# Patient Record
Sex: Female | Born: 1990 | Race: Asian | Hispanic: No | Marital: Married | Smoking: Never smoker
Health system: Southern US, Community
[De-identification: ages and names within clinical notes are randomized; demographics above are authoritative.]

## PROBLEM LIST (undated history)

## (undated) ENCOUNTER — Inpatient Hospital Stay (HOSPITAL_COMMUNITY): Payer: Self-pay

## (undated) DIAGNOSIS — T7840XA Allergy, unspecified, initial encounter: Secondary | ICD-10-CM

## (undated) DIAGNOSIS — F32A Depression, unspecified: Secondary | ICD-10-CM

## (undated) DIAGNOSIS — F419 Anxiety disorder, unspecified: Secondary | ICD-10-CM

## (undated) DIAGNOSIS — O24419 Gestational diabetes mellitus in pregnancy, unspecified control: Secondary | ICD-10-CM

## (undated) HISTORY — DX: Gestational diabetes mellitus in pregnancy, unspecified control: O24.419

## (undated) HISTORY — DX: Allergy, unspecified, initial encounter: T78.40XA

## (undated) HISTORY — DX: Anxiety disorder, unspecified: F41.9

## (undated) HISTORY — DX: Depression, unspecified: F32.A

---
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
## 2016-02-19 ENCOUNTER — Encounter (HOSPITAL_COMMUNITY): Payer: Self-pay | Admitting: *Deleted

## 2016-02-19 ENCOUNTER — Inpatient Hospital Stay (HOSPITAL_COMMUNITY)
Admission: AD | Admit: 2016-02-19 | Discharge: 2016-02-19 | Disposition: A | Discharge status: Home / Self Care | Payer: 59 | Source: Ambulatory Visit | Attending: Family Medicine | Admitting: Family Medicine

## 2016-02-19 DIAGNOSIS — O039 Complete or unspecified spontaneous abortion without complication: Secondary | ICD-10-CM | POA: Diagnosis not present

## 2016-02-19 DIAGNOSIS — O021 Missed abortion: Secondary | ICD-10-CM | POA: Diagnosis not present

## 2016-02-19 LAB — CBC
HEMATOCRIT: 33.3 % — AB (ref 36.0–46.0)
Hemoglobin: 11.6 g/dL — ABNORMAL LOW (ref 12.0–15.0)
MCH: 31.4 pg (ref 26.0–34.0)
MCHC: 34.8 g/dL (ref 30.0–36.0)
MCV: 90 fL (ref 78.0–100.0)
Platelets: 301 10*3/uL (ref 150–400)
RBC: 3.7 MIL/uL — ABNORMAL LOW (ref 3.87–5.11)
RDW: 12.1 % (ref 11.5–15.5)
WBC: 24.4 10*3/uL — AB (ref 4.0–10.5)

## 2016-02-19 LAB — ABO/RH: ABO/RH(D): A POS

## 2016-02-19 LAB — TYPE AND SCREEN
ABO/RH(D): A POS
Antibody Screen: NEGATIVE

## 2016-02-19 MED ORDER — MISOPROSTOL 200 MCG PO TABS
800.0000 ug | ORAL_TABLET | Freq: Once | ORAL | Status: AC
  Administered 2016-02-19: 800 ug via ORAL
  Filled 2016-02-19: qty 4

## 2016-02-19 MED ORDER — OXYCODONE-ACETAMINOPHEN 5-325 MG PO TABS: 1.0000 | ORAL_TABLET | ORAL | 0 refills | Status: DC | PRN

## 2016-02-19 MED ORDER — IBUPROFEN 600 MG PO TABS
600.0000 mg | ORAL_TABLET | Freq: Once | ORAL | Status: AC
  Administered 2016-02-19: 600 mg via ORAL
  Filled 2016-02-19: qty 1

## 2016-02-19 NOTE — Progress Notes (Addendum)
Chaplain paged to provide emotional and spiritual care to Lynn Torres after she experienced a fetal demise. When chaplain arrived Lynn Deleon decided she did not wish to talk with a chaplain, and declined the visit through her room nurse.  Please page a chaplain should Lynn Deleon request or need emotional or spiritual care. Chaplains are available at all times.  Benjie Karvonenharles D. Bayyinah Dukeman, DMin. MDiv Night Chaplain

## 2016-02-19 NOTE — MAU Provider Note (Signed)
History     CSN: [id number]  Arrival date and time: 02/19/16 1439   None     No chief complaint on file.  HPI   Lynn Torres is a 25 y.o. female G1P0010 at unknown gestation here in MAU after passing a "fetus" at home. She did not know she was pregnant. She took a pregnancy test 2.5 months ago at home and it was negative. She started bleeding 2 days ago and she thought it was her menstrual cycle; she has a history of abnormal cycles. She started cramping heavily this morning and passed a fetus around 1:30 pm. She continued to have some bleeding and felt a cord in her vagina. She feels that the cord has gradually gotten longer, however she is concerned it is still attached inside of her. She is having mild contraction/cramping like pain that comes and goes.   The patient is a Engineer, civil (consulting)nurse at [employer].  She did not bring the fetus with her to MAU however is ok with having someone go home to get the fetus.   OB History    Gravida Para Term Preterm AB Living   1 0     1     SAB TAB Ectopic Multiple Live Births   1              History reviewed. No pertinent past medical history.  History reviewed. No pertinent surgical history.  History reviewed. No pertinent family history.  Social History  Substance Use Topics  . Smoking status: Never Smoker  . Smokeless tobacco: Never Used  . Alcohol use No    Allergies: No Known Allergies  No prescriptions prior to admission.   Results for orders placed or performed during the hospital encounter of 02/19/16 (from the past 48 hour(s))  [facility] screen [facility]     Status: None   Collection Time: 02/19/16  4:12 PM  Result Value Ref Range   ABO/RH(D) A POS    Antibody Screen NEG    Sample Expiration 02/22/2016   CBC     Status: Abnormal   Collection Time: 02/19/16  4:12 PM  Result Value Ref Range   WBC 24.4 (H) 4.0 - 10.5 K/uL   RBC 3.70 (L) 3.87 - 5.11 MIL/uL   Hemoglobin 11.6 (L) 12.0 - 15.0 g/dL   HCT 40.933.3  (L) 81.136.0 - 46.0 %   MCV 90.0 78.0 - 100.0 fL   MCH 31.4 26.0 - 34.0 pg   MCHC 34.8 30.0 - 36.0 g/dL   RDW 91.412.1 78.211.5 - 95.615.5 %   Platelets 301 150 - 400 K/uL    Review of Systems  Gastrointestinal: Positive for abdominal pain. Negative for nausea.  Neurological: Positive for headaches.   Physical Exam   Blood pressure 134/82, pulse 104, temperature 98.2 F (36.8 C), resp. rate 18.   Patient Vitals for the past 24 hrs:  BP Temp Temp src Pulse Resp  02/19/16 1921 126/74 - - 80 18  02/19/16 1749 120/78 98.3 F (36.8 C) Oral 87 18  02/19/16 1531 134/82 98.2 F (36.8 C) - 104 18    Physical Exam  Constitutional: She is oriented to person, place, and time. She appears well-developed and well-nourished. No distress.  Respiratory: Effort normal.  GI: Soft. She exhibits no distension. There is tenderness in the periumbilical area and suprapubic area.  Genitourinary:  Genitourinary Comments: Cord noted in the vagina  Bleeding bright red, and moderate amount upon arrival   Musculoskeletal:  Normal range of motion.  Neurological: She is alert and oriented to person, place, and time.  Skin: Skin is warm. She is not diaphoretic.  Psychiatric: Her behavior is normal.    MAU Course  Procedures  None  MDM  A positive blood type CBC & urine drug screen  Dr. Doreene Burke to the bedside> See Dr. Denyce Robert note and physical exam. Cytotec 800 mcg buccal given Ibuprofen 600 mg PO> pain significantly improved  Delivery of placenta successful following cytotec. Placenta appears to be intact.  Patient desires the fetus to be cremated; arrangements made through the nursing supervisor.  L&D nurse called for infant photo's, foot prints and hand prints   Fetal weight 137 grams, CRL 13 cm. These findings consistent with approximately [redacted] weeks gestation. Fetus appears female.  Placenta sent to pathology, fetus to the morgue.   Prior to discharge the patient;s bleeding is minimal to none on pad.  Pain is 0/10. Patient leaving MAU with significant other and friend.   Assessment and Plan    A:  1. SAB (spontaneous abortion)     P:  Discharge home in stable condition Rx: Percocet  Ok to use ibuprofen as directed on the bottle Follow up in the [facility], message sent to the clinic for follow up.  Strict return precautions Bleeding precautions discussed at length Support given.    Duane Lope, NP 02/19/2016 7:33 PM

## 2016-02-19 NOTE — Discharge Instructions (Signed)
Miscarriage  A miscarriage is the sudden loss of an unborn baby (fetus) before the 20th week of pregnancy. Most miscarriages happen in the first 3 months of pregnancy. Sometimes, it happens before a woman even knows she is pregnant. A miscarriage is also called a "spontaneous miscarriage" or "early pregnancy loss." Having a miscarriage can be an emotional experience. Talk with your caregiver about any questions you may have about miscarrying, the grieving process, and your future pregnancy plans.  CAUSES    Problems with the fetal chromosomes that make it impossible for the baby to develop normally. Problems with the baby's genes or chromosomes are most often the result of errors that occur, by chance, as the embryo divides and grows. The problems are not inherited from the parents.   Infection of the cervix or uterus.    Hormone problems.    Problems with the cervix, such as having an incompetent cervix. This is when the tissue in the cervix is not strong enough to hold the pregnancy.    Problems with the uterus, such as an abnormally shaped uterus, uterine fibroids, or congenital abnormalities.    Certain medical conditions.    Smoking, drinking alcohol, or taking illegal drugs.    Trauma.   Often, the cause of a miscarriage is unknown.   SYMPTOMS    Vaginal bleeding or spotting, with or without cramps or pain.   Pain or cramping in the abdomen or lower back.   Passing fluid, tissue, or blood clots from the vagina.  DIAGNOSIS   Your caregiver will perform a physical exam. You may also have an ultrasound to confirm the miscarriage. Blood or urine tests may also be ordered.  TREATMENT    Sometimes, treatment is not necessary if you naturally pass all the fetal tissue that was in the uterus. If some of the fetus or placenta remains in the body (incomplete miscarriage), tissue left behind may become infected and must be removed. Usually, a dilation and curettage (D and C) procedure is performed.  During a D and C procedure, the cervix is widened (dilated) and any remaining fetal or placental tissue is gently removed from the uterus.   Antibiotic medicines are prescribed if there is an infection. Other medicines may be given to reduce the size of the uterus (contract) if there is a lot of bleeding.   If you have Rh negative blood and your baby was Rh positive, you will need a Rh immunoglobulin shot. This shot will protect any future baby from having Rh blood problems in future pregnancies.  HOME CARE INSTRUCTIONS    Your caregiver may order bed rest or may allow you to continue light activity. Resume activity as directed by your caregiver.   Have someone help with home and family responsibilities during this time.    Keep track of the number of sanitary pads you use each day and how soaked (saturated) they are. Write down this information.    Do not use tampons. Do not douche or have sexual intercourse until approved by your caregiver.    Only take over-the-counter or prescription medicines for pain or discomfort as directed by your caregiver.    Do not take aspirin. Aspirin can cause bleeding.    Keep all follow-up appointments with your caregiver.    If you or your partner have problems with grieving, talk to your caregiver or seek counseling to help cope with the pregnancy loss. Allow enough time to grieve before trying to get pregnant again.     SEEK IMMEDIATE MEDICAL CARE IF:    You have severe cramps or pain in your back or abdomen.   You have a fever.   You pass large blood clots (walnut-sized or larger) ortissue from your vagina. Save any tissue for your caregiver to inspect.    Your bleeding increases.    You have a thick, bad-smelling vaginal discharge.   You become lightheaded, weak, or you faint.    You have chills.   MAKE SURE YOU:   Understand these instructions.   Will watch your condition.   Will get help right away if you are not doing well or get worse.     This  information is not intended to replace advice given to you by your health care provider. Make sure you discuss any questions you have with your health care provider.     Document Released: 10/16/2000 Document Revised: 08/17/2012 Document Reviewed: 06/11/2011  Elsevier Interactive Patient Education 2016 Elsevier Inc[employer]

## 2016-02-19 NOTE — MAU Note (Signed)
Pt presents to MAU stating that she had miscarried this morning at home.. States that she does not remember when her last cycle was and passed the baby at home. Small amount of bleeding noted. Denies any pain at present

## 2016-02-20 ENCOUNTER — Telehealth (HOSPITAL_COMMUNITY): Payer: Self-pay | Admitting: Lactation Services

## 2016-02-20 MED FILL — OXYCODONE/APAP 5/325 MG TAB: 5-325 | 1 days supply | Qty: 6 | Fill #0

## 2016-02-20 NOTE — Telephone Encounter (Signed)
I left message with patient, wanting to provide support and answer any questions she may have about breast management/lactation suppression, as her milk may come in after her miscarriage. I left the Lactation phone number in case she is interested in speaking with one of us. Glenetta HewKim Aliyah Abeyta, RN, IBCLC

## 2016-02-21 ENCOUNTER — Encounter (HOSPITAL_COMMUNITY): Payer: Self-pay | Admitting: Obstetrics and Gynecology

## 2016-03-06 ENCOUNTER — Encounter: Payer: 59 | Admitting: Obstetrics and Gynecology

## 2016-04-03 ENCOUNTER — Encounter: Payer: Self-pay | Admitting: Obstetrics and Gynecology

## 2016-04-03 ENCOUNTER — Ambulatory Visit (INDEPENDENT_AMBULATORY_CARE_PROVIDER_SITE_OTHER): Payer: 59 | Admitting: Obstetrics and Gynecology

## 2016-04-03 ENCOUNTER — Ambulatory Visit (INDEPENDENT_AMBULATORY_CARE_PROVIDER_SITE_OTHER): Payer: Self-pay | Admitting: Clinical

## 2016-04-03 VITALS — BP 136/88 | HR 91 | Ht 64.0 in | Wt 166.8 lb

## 2016-04-03 DIAGNOSIS — Z3041 Encounter for surveillance of contraceptive pills: Secondary | ICD-10-CM

## 2016-04-03 DIAGNOSIS — F4320 Adjustment disorder, unspecified: Secondary | ICD-10-CM

## 2016-04-03 DIAGNOSIS — O039 Complete or unspecified spontaneous abortion without complication: Secondary | ICD-10-CM

## 2016-04-03 DIAGNOSIS — R4589 Other symptoms and signs involving emotional state: Secondary | ICD-10-CM | POA: Insufficient documentation

## 2016-04-03 DIAGNOSIS — Z01419 Encounter for gynecological examination (general) (routine) without abnormal findings: Secondary | ICD-10-CM

## 2016-04-03 DIAGNOSIS — Z5189 Encounter for other specified aftercare: Secondary | ICD-10-CM

## 2016-04-03 DIAGNOSIS — F329 Major depressive disorder, single episode, unspecified: Secondary | ICD-10-CM | POA: Insufficient documentation

## 2016-04-03 DIAGNOSIS — F4321 Adjustment disorder with depressed mood: Secondary | ICD-10-CM

## 2016-04-03 MED ORDER — HYDROXYZINE PAMOATE 25 MG PO CAPS: 25.0000 mg | ORAL_CAPSULE | Freq: Three times a day (TID) | ORAL | 1 refills | Status: DC | PRN

## 2016-04-03 MED ORDER — SERTRALINE HCL 25 MG PO TABS: 25.0000 mg | ORAL_TABLET | Freq: Every day | ORAL | 3 refills | Status: DC

## 2016-04-03 MED FILL — SERTRALINE HCL 25 MG TABLET: 25 | 30 days supply | Qty: 30 | Fill #0

## 2016-04-03 MED FILL — HYDROXYZINE PAM 25 MG CAP: 25 | 10 days supply | Qty: 30 | Fill #0

## 2016-04-03 NOTE — Progress Notes (Signed)
Subjective:     Lynn Torres is a 25 y.o. female and is here for a comprehensive physical exam. The patient reports problems - Difficulty with sleep pattern, depressive symptoms following SAB, She is interested in starting birth control pills, however has had unprotected sex in the last 1 week. She has never had a pap smear and is interested in having that today.   Patient was seen in MAU on 10/16 after delivering a 17 week non-viable fetus at home. She is here for follow up.   Social History   Social History  . Marital status: Married    Spouse name: N/A  . Number of children: N/A  . Years of education: N/A   Occupational History  . Not on file.   Social History Main Topics  . Smoking status: Never Smoker  . Smokeless tobacco: Never Used  . Alcohol use No  . Drug use: No  . Sexual activity: Yes    Birth control/ protection: None   Other Topics Concern  . Not on file   Social History Narrative  . No narrative on file   Health Maintenance  Topic Date Due  . HIV Screening  08/10/2007  . TETANUS/TDAP  08/10/2011  . PAP SMEAR  08/09/2013  . INFLUENZA VACCINE  12/05/2015    The following portions of the patient's history were reviewed and updated as appropriate: allergies, current medications, past family history, past medical history, past social history, past surgical history and problem list.  Review of Systems Behavioral/Psych: positive for depression, fatigue, loss of interest in favorite activities, sexual difficulty and sleep disturbance   Objective:   General appearance: alert, cooperative and no distress Head: Normocephalic, without obvious abnormality, atraumatic Eyes: conjunctivae/corneas clear. PERRL, EOM's intact. Fundi benign. Nose: Nares normal. Septum midline. Mucosa normal. No drainage or sinus tenderness. Abdomen: soft, non-tender; bowel sounds normal; no masses,  no organomegaly Pelvic: cervix normal in appearance, external genitalia normal, no adnexal  masses or tenderness, no cervical motion tenderness, rectovaginal septum normal, uterus normal size, shape, and consistency and vagina normal without discharge Extremities: extremities normal, atraumatic, no cyanosis or edema Skin: Skin color, texture, turgor normal. No rashes or lesions    Assessment:   Healthy female exam.   1. Pap test, as part of routine gynecological examination  - Cytology - PAP  2. Follow-up visit after miscarriage - referral to behavioral specialist   3. Surveillance for birth control, oral contraceptives  - unprotected sex 1 week ago, [facility]. If negative, patient ok for oral contraceptives.   4. Symptoms of depression  - Rx: Zoloft & vistaril for sleep  Discussed importance of PCP  Patient to see Asher MuirJamie today; information given for [facility] employee counseling    Plan:   Return to the [facility] in 1-2 weeks for pregnancy test See After Visit Summary for Counseling Recommendations      Duane LopeJennifer I Jurney Overacker, NP

## 2016-04-03 NOTE — BH Specialist Note (Signed)
Session Start time: 10:20   End Time: 10:45 Total Time:  25 minutes Type of Service: Behavioral Health - Individual/Family Interpreter: No.   Interpreter Name & Language: n/a # [facility]   SUBJECTIVE: Lynn Torres is a 25 y.o. female  Pt. was referred by Lynn CarbonJennifer Rasch, NP for:  anxiety and depression. Pt. reports the following symptoms/concerns: Pt states that she has been feeling depressed and anxious since miscarriage, and that it has affected her sleep, including going several days without sleep, then oversleeping several days, has had panic attacks and symptoms of depression in past; copes by writing in her journal. 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  Pt may benefit from psychoeducation and brief therapeutic interventions regarding coping with symptoms of anxiety and depression related to recent loss.   PLAN: 1. F/U with behavioral health clinician: As needed, if symptoms increase or do not improve 2. Behavioral Health meds: none 3. Behavioral recommendations:  -Consider implementing "Worry Hour" technique into daily journaling -Consider calming sound apps at bedtime for improved sleep -Read educational material regarding coping with symptoms of anxiety and depression(including panic attacks), and share information with husband -Consider calling Employee  Assistance Counseling Program(EACP) to set up an appointment, as needed 4. Referral: Brief Counseling/Psychotherapy, Psychoeducation and Referral to Counselor/Psychotherapist  [facility]  Behavioral Health Clinician  Marlon PelWarmhandoff:   Warm Hand Off Completed.        Depression screen PHQ 2/9 04/03/2016  Decreased Interest 1  Down, Depressed, Hopeless 1  PHQ - 2 Score 2  Altered sleeping 2  Tired, decreased energy 2  Change in appetite 1  Feeling bad or failure about yourself  2  Trouble concentrating 1  Moving slowly or fidgety/restless 2  Suicidal thoughts 0  PHQ-9 Score 12   GAD 7 : Generalized Anxiety Score 04/03/2016  Nervous, Anxious, on Edge 2  Control/stop worrying 2  Worry too much - different things 2  Trouble relaxing 2  Restless 2  Easily annoyed or irritable 2  Afraid - awful might happen 1  Total GAD 7 Score 13

## 2016-04-03 NOTE — Patient Instructions (Addendum)
Place annual gynecologic exam patient instructions here.

## 2016-04-03 NOTE — BH Specialist Note (Signed)
Error

## 2016-04-03 NOTE — Progress Notes (Signed)
Here for follow up after miscarriage . Last unprotected intercourse 7 days.

## 2016-04-05 LAB — CYTOLOGY - PAP: Diagnosis: NEGATIVE

## 2016-04-17 ENCOUNTER — Ambulatory Visit (INDEPENDENT_AMBULATORY_CARE_PROVIDER_SITE_OTHER): Payer: 59 | Admitting: General Practice

## 2016-04-17 DIAGNOSIS — Z3202 Encounter for pregnancy test, result negative: Secondary | ICD-10-CM

## 2016-04-17 DIAGNOSIS — Z30011 Encounter for initial prescription of contraceptive pills: Secondary | ICD-10-CM

## 2016-04-17 MED ORDER — NORGESTIMATE-ETH ESTRADIOL 0.25-35 MG-MCG PO TABS: 1.0000 | ORAL_TABLET | Freq: Every day | ORAL | 11 refills | Status: DC

## 2016-04-17 MED FILL — MONO-LINYAH 28 TABLET: 0.25-35 | 84 days supply | Qty: 84 | Fill #0

## 2016-04-17 NOTE — Progress Notes (Signed)
Patient here to obtain negative upt to start OCPs. upt negative. Per Vonzella NippleJulie Wenzel, can send in sprintec. Patient informed. Patient verbalized understanding & had no questions

## 2016-04-18 LAB — POCT PREGNANCY, URINE: Preg Test, Ur: NEGATIVE

## 2016-04-24 DIAGNOSIS — J020 Streptococcal pharyngitis: Secondary | ICD-10-CM | POA: Diagnosis not present

## 2016-04-24 MED FILL — PENICILLIN VK 500 MG TABLET: 500 | 10 days supply | Qty: 20 | Fill #0

## 2016-05-07 MED FILL — SERTRALINE HCL 25 MG TABLET: 25 | 30 days supply | Qty: 30 | Fill #1

## 2016-07-18 MED FILL — MONO-LINYAH 28 TABLET: 0.25-35 | 84 days supply | Qty: 84 | Fill #1

## 2016-10-09 MED FILL — NORG-ETHIN ESTRA 0.25-0.035: 0.25-35 | 84 days supply | Qty: 84 | Fill #2

## 2017-01-01 MED FILL — NORG-ETHIN ESTRA 0.25-0.035: 0.25-35 | 84 days supply | Qty: 84 | Fill #3

## 2017-02-13 DIAGNOSIS — A0839 Other viral enteritis: Secondary | ICD-10-CM | POA: Diagnosis not present

## 2017-02-13 MED FILL — ONDANSETRON HCL 4 MG TABLET: 4 | 3 days supply | Qty: 12 | Fill #0

## 2017-04-28 ENCOUNTER — Other Ambulatory Visit: Payer: Self-pay

## 2017-04-28 ENCOUNTER — Emergency Department (HOSPITAL_COMMUNITY): Payer: 59

## 2017-04-28 ENCOUNTER — Encounter (HOSPITAL_COMMUNITY): Payer: Self-pay

## 2017-04-28 ENCOUNTER — Emergency Department (HOSPITAL_COMMUNITY)
Admission: EM | Admit: 2017-04-28 | Discharge: 2017-04-28 | Disposition: A | Discharge status: Home / Self Care | Payer: 59 | Attending: Emergency Medicine | Admitting: Emergency Medicine

## 2017-04-28 DIAGNOSIS — R079 Chest pain, unspecified: Secondary | ICD-10-CM | POA: Diagnosis not present

## 2017-04-28 DIAGNOSIS — R222 Localized swelling, mass and lump, trunk: Secondary | ICD-10-CM | POA: Diagnosis not present

## 2017-04-28 DIAGNOSIS — Z79899 Other long term (current) drug therapy: Secondary | ICD-10-CM | POA: Insufficient documentation

## 2017-04-28 DIAGNOSIS — R52 Pain, unspecified: Secondary | ICD-10-CM

## 2017-04-28 DIAGNOSIS — L03312 Cellulitis of back [any part except buttock]: Secondary | ICD-10-CM | POA: Insufficient documentation

## 2017-04-28 DIAGNOSIS — L02212 Cutaneous abscess of back [any part, except buttock]: Secondary | ICD-10-CM | POA: Diagnosis not present

## 2017-04-28 LAB — CBC WITH DIFFERENTIAL/PLATELET
BASOS PCT: 0 %
Basophils Absolute: 0 10*3/uL (ref 0.0–0.1)
Eosinophils Absolute: 0.2 10*3/uL (ref 0.0–0.7)
Eosinophils Relative: 1 %
HEMATOCRIT: 44.1 % (ref 36.0–46.0)
HEMOGLOBIN: 15.1 g/dL — AB (ref 12.0–15.0)
LYMPHS ABS: 2.8 10*3/uL (ref 0.7–4.0)
LYMPHS PCT: 21 %
MCH: 31 pg (ref 26.0–34.0)
MCHC: 34.2 g/dL (ref 30.0–36.0)
MCV: 90.6 fL (ref 78.0–100.0)
MONOS PCT: 6 %
Monocytes Absolute: 0.8 10*3/uL (ref 0.1–1.0)
NEUTROS ABS: 9.9 10*3/uL — AB (ref 1.7–7.7)
NEUTROS PCT: 72 %
Platelets: 343 10*3/uL (ref 150–400)
RBC: 4.87 MIL/uL (ref 3.87–5.11)
RDW: 11.9 % (ref 11.5–15.5)
WBC: 13.7 10*3/uL — ABNORMAL HIGH (ref 4.0–10.5)

## 2017-04-28 LAB — COMPREHENSIVE METABOLIC PANEL
ALK PHOS: 48 U/L (ref 38–126)
ALT: 51 U/L (ref 14–54)
AST: 31 U/L (ref 15–41)
Albumin: 4.3 g/dL (ref 3.5–5.0)
Anion gap: 14 (ref 5–15)
BUN: 8 mg/dL (ref 6–20)
CALCIUM: 9.8 mg/dL (ref 8.9–10.3)
CHLORIDE: 92 mmol/L — AB (ref 101–111)
CO2: 25 mmol/L (ref 22–32)
CREATININE: 0.57 mg/dL (ref 0.44–1.00)
GFR calc Af Amer: >60 mL/min (ref >60)
GFR calc non Af Amer: >60 mL/min (ref >60)
Glucose, Bld: 418 mg/dL — ABNORMAL HIGH (ref 65–99)
Potassium: 3.7 mmol/L (ref 3.5–5.1)
SODIUM: 131 mmol/L — AB (ref 135–145)
Total Bilirubin: 0.8 mg/dL (ref 0.3–1.2)
Total Protein: 8.4 g/dL — ABNORMAL HIGH (ref 6.5–8.1)

## 2017-04-28 LAB — I-STAT CG4 LACTIC ACID, ED
Lactic Acid, Venous: 3.54 mmol/L (ref 0.5–1.9)
Lactic Acid, Venous: 1.73 mmol/L (ref 0.5–1.9)

## 2017-04-28 LAB — I-STAT BETA HCG BLOOD, ED (MC, WL, AP ONLY)

## 2017-04-28 MED ORDER — CLINDAMYCIN HCL 150 MG PO CAPS: 300.0000 mg | ORAL_CAPSULE | Freq: Three times a day (TID) | ORAL | 0 refills | Status: AC

## 2017-04-28 MED ORDER — IOPAMIDOL (ISOVUE-370) INJECTION 76%
INTRAVENOUS | Status: AC
  Administered 2017-04-28: 100 mL via INTRAVENOUS
  Filled 2017-04-28: qty 100

## 2017-04-28 MED ORDER — SODIUM CHLORIDE 0.9 % IV BOLUS (SEPSIS)
1000.0000 mL | Freq: Once | INTRAVENOUS | Status: AC
  Administered 2017-04-28: 1000 mL via INTRAVENOUS

## 2017-04-28 MED ORDER — ACETAMINOPHEN 500 MG PO TABS
1000.0000 mg | ORAL_TABLET | Freq: Once | ORAL | Status: AC
Start: 2017-04-28 — End: 2017-04-28
  Administered 2017-04-28: 1000 mg via ORAL
  Filled 2017-04-28: qty 2

## 2017-04-28 MED ORDER — VANCOMYCIN HCL IN DEXTROSE 1-5 GM/200ML-% IV SOLN
1000.0000 mg | Freq: Once | INTRAVENOUS | Status: AC
  Administered 2017-04-28: 1000 mg via INTRAVENOUS
  Filled 2017-04-28: qty 200

## 2017-04-28 MED ORDER — PIPERACILLIN-TAZOBACTAM 3.375 G IVPB 30 MIN
3.3750 g | Freq: Once | INTRAVENOUS | Status: AC
  Administered 2017-04-28: 3.375 g via INTRAVENOUS
  Filled 2017-04-28: qty 50

## 2017-04-28 MED FILL — CLINDAMYCIN HCL 150 MG CAPS: 150 | 7 days supply | Qty: 42 | Fill #0

## 2017-04-28 NOTE — ED Triage Notes (Signed)
She c/o abscess at left upper post. Thoracic area x 3-4 days. She is in no distress.

## 2017-04-28 NOTE — Progress Notes (Signed)
A consult was received from an ED physician for Vancomycin and Zosyn per pharmacy dosing for suspected sepsis.  The patient's profile has been reviewed for ht/wt/allergies/indication/available labs.   A one time order has been placed for Vancomycin 1gm, Zosyn 3.375gm.  Further antibiotics/pharmacy consults should be ordered by admitting physician if indicated.                       Thank you, Otho BellowsGreen, Jyren Cerasoli L 04/28/2017  10:33 AM

## 2017-04-28 NOTE — ED Provider Notes (Signed)
[facility]-EMERGENCY DEPT Provider Note   CSN: [id number] Arrival date & time: 04/28/17  4098     History   Chief Complaint Chief Complaint  Patient presents with  . Abscess    HPI Lynn Torres is a 26 y.o. female.  HPI   Presents with concern for suspected abscess on her back. Reports it began as a small boil, 2mm on 12/21, and over the last few days has significantly increased and become increasingly painful and swollen.  Area has been red. Has been trying to ice it and take ibuprofen without relief.  Just flew back from the [place] ago. Reports the pain in the back is now radiating to the front. Does not have significant dyspnea.  No fevers, no nausea or vomiting.   History reviewed. No pertinent past medical history.  Patient Active Problem List   Diagnosis Date Noted  . Symptoms of depression 04/03/2016  . Surveillance for birth control, oral contraceptives 04/03/2016  . Follow-up visit after miscarriage 04/03/2016  . Pap test, as part of routine gynecological examination 04/03/2016    No past surgical history on file.  OB History    Gravida Para Term Preterm AB Living   1 0 0 0 1 0   SAB TAB Ectopic Multiple Live Births   1 0 0 0 0       Home Medications    Prior to Admission medications   Medication Sig Start Date End Date Taking? Authorizing Provider  ibuprofen (ADVIL,MOTRIN) 200 MG tablet Take 400 mg by mouth every 6 (six) hours as needed for mild pain.   Yes [provider]  clindamycin (CLEOCIN) 150 MG capsule Take 2 capsules (300 mg total) by mouth 3 (three) times daily for 7 days. 04/28/17 05/05/17  Alvira Monday, MD  hydrOXYzine (VISTARIL) 25 MG capsule Take 1 capsule (25 mg total) by mouth 3 (three) times daily as needed for anxiety (sleep). Patient not taking: Reported on 04/28/2017 04/03/16   Rasch, Victorino Dike I, NP  norgestimate-ethinyl estradiol (ORTHO-CYCLEN,SPRINTEC,PREVIFEM) 0.25-35 MG-MCG tablet Take 1  tablet by mouth daily. Patient not taking: Reported on 04/28/2017 04/17/16   Marny Lowenstein, PA-C  oxyCODONE-acetaminophen (PERCOCET/ROXICET) 5-325 MG tablet Take 1-2 tablets by mouth every 4 (four) hours as needed for severe pain. Patient not taking: Reported on 04/28/2017 02/19/16   Rasch, Victorino Dike I, NP  sertraline (ZOLOFT) 25 MG tablet Take 1 tablet (25 mg total) by mouth at bedtime. Patient not taking: Reported on 04/28/2017 04/03/16   Rasch, Harolyn Rutherford, NP    Family History No family history on file.  Social History Social History   Tobacco Use  . Smoking status: Never Smoker  . Smokeless tobacco: Never Used  Substance Use Topics  . Alcohol use: No  . Drug use: No     Allergies   Patient has no known allergies.   Review of Systems Review of Systems  Constitutional: Negative for fever.  HENT: Negative for sore throat.   Eyes: Negative for visual disturbance.  Respiratory: Negative for cough and shortness of breath.   Cardiovascular: Positive for chest pain.  Gastrointestinal: Negative for abdominal pain.  Genitourinary: Negative for difficulty urinating.  Musculoskeletal: Negative for back pain and neck pain.  Skin: Positive for rash.  Neurological: Negative for syncope and headaches.     Physical Exam Updated Vital Signs BP (!) 133/91 (BP Location: Right Arm)   Pulse 87   Temp 98.2 F (36.8 C) (Oral)   Resp 12  Ht 5\' 4"  (1.626 m)   Wt 74.8 kg (165 lb)   LMP 04/02/2017 (Approximate)   SpO2 96%   BMI 28.32 kg/m   Physical Exam  Constitutional: She is oriented to person, place, and time. She appears well-developed and well-nourished. No distress.  HENT:  Head: Normocephalic and atraumatic.  Eyes: Conjunctivae and EOM are normal.  Neck: Normal range of motion.  Cardiovascular: Regular rhythm, normal heart sounds and intact distal pulses. Tachycardia present. Exam reveals no gallop and no friction rub.  No murmur heard. Pulmonary/Chest: Effort normal  and breath sounds normal. No respiratory distress. She has no wheezes. She has no rales.  Abdominal: Soft. She exhibits no distension. There is no tenderness. There is no guarding.  Musculoskeletal: She exhibits no edema or tenderness.  Neurological: She is alert and oriented to person, place, and time.  Skin: Skin is warm and dry. No rash noted. She is not diaphoretic. There is erythema (4x4 area of induration and erythema left of midline thoracic back).  Nursing note and vitals reviewed.    ED Treatments / Results  Labs (all labs ordered are listed, but only abnormal results are displayed) Labs Reviewed  COMPREHENSIVE METABOLIC PANEL - Abnormal; Notable for the following components:      Result Value   Sodium 131 (*)    Chloride 92 (*)    Glucose, Bld 418 (*)    Total Protein 8.4 (*)    All other components within normal limits  CBC WITH DIFFERENTIAL/PLATELET - Abnormal; Notable for the following components:   WBC 13.7 (*)    Hemoglobin 15.1 (*)    Neutro Abs 9.9 (*)    All other components within normal limits  I-STAT CG4 LACTIC ACID, ED - Abnormal; Notable for the following components:   Lactic Acid, Venous 3.54 (*)    All other components within normal limits  I-STAT BETA HCG BLOOD, ED ([facility], [facility], [facility] ONLY)  I-STAT CG4 LACTIC ACID, ED    EKG  EKG Interpretation None       Radiology Ct Angio Chest Pe W And/or Wo Contrast  Result Date: 04/28/2017 CLINICAL DATA:  Chest pain EXAM: CT ANGIOGRAPHY CHEST WITH CONTRAST TECHNIQUE: Multidetector CT imaging of the chest was performed using the standard protocol during bolus administration of intravenous contrast. Multiplanar CT image reconstructions and MIPs were obtained to evaluate the vascular anatomy. CONTRAST:  100mL ISOVUE-370 IOPAMIDOL (ISOVUE-370) INJECTION 76% COMPARISON:  None. FINDINGS: Cardiovascular: There is no appreciable pulmonary embolus. No thoracic aortic aneurysm or dissection evident. Visualized great vessels  appear normal. No appreciable pericardial effusion or pericardial thickening. Mediastinum/Nodes: Visualized thyroid appears normal. There is no appreciable thoracic adenopathy. No esophageal lesions are evident. Lungs/Pleura: Lungs are clear. No pleural effusion or pleural thickening. Upper Abdomen: There is hepatic steatosis. Visualized upper abdominal structures otherwise appear unremarkable. Musculoskeletal: There is soft tissue thickening and stranding in the fatty tissues posterior to the midthoracic region. No mass or fluid collection is seen in this area. No blastic or lytic bone lesions are evident. No paraspinous lesions are evident. Review of the MIP images confirms the above findings. IMPRESSION: 1.  No demonstrable pulmonary embolus. 2.  Lungs clear.  No evident adenopathy. 3. Subcutaneous soft tissue thickening and stranding in the fat to the left of midline in the midthoracic region without associated fluid collection or mass. No paraspinous lesions are evident on this study. No bony abnormality is evident. 4.  There is hepatic steatosis. Electronically Signed   By: Chrissie NoaWilliam  Margarita GrizzleWoodruff III M.D.   On: 04/28/2017 11:56   Ct T-spine No Charge  Result Date: 04/28/2017 CLINICAL DATA:  Left upper posterior thoracic pain. EXAM: CT THORACIC SPINE WITHOUT CONTRAST TECHNIQUE: Multidetector CT images of the thoracic were obtained using the standard protocol without intravenous contrast. COMPARISON:  100 mL Isovue-300 FINDINGS: Alignment: Normal. Vertebrae: No acute fracture or focal pathologic process. Paraspinal and other soft tissues: Negative. Disc levels: Disc spaces are maintained. No foraminal or central canal stenosis. Soft tissue: Soft tissue edema in the subcutaneous fat of the back at the level of the midthoracic spine with overlying skin thickening. No focal fluid collection to suggest an abscess. IMPRESSION: 1.  No acute osseous injury of the thoracic spine. 2. Cellulitis of the back at the level  of the midthoracic spine. No focal fluid collection to suggest an abscess. Electronically Signed   By: Elige KoHetal  Patel   On: 04/28/2017 12:16    Procedures Procedures (including critical care time)  Medications Ordered in ED Medications  sodium chloride 0.9 % bolus 1,000 mL (0 mLs Intravenous Stopped 04/28/17 1211)  sodium chloride 0.9 % bolus 1,000 mL (0 mLs Intravenous Stopped 04/28/17 1432)  piperacillin-tazobactam (ZOSYN) IVPB 3.375 g (0 g Intravenous Stopped 04/28/17 1211)  vancomycin (VANCOCIN) IVPB 1000 mg/200 mL premix (0 mg Intravenous Stopped 04/28/17 1314)  iopamidol (ISOVUE-370) 76 % injection (100 mLs Intravenous Contrast Given 04/28/17 1120)  acetaminophen (TYLENOL) tablet 1,000 mg (1,000 mg Oral Given 04/28/17 1111)     Initial Impression / Assessment and Plan / ED Course  I have reviewed the triage vital signs and the nursing notes.  Pertinent labs & imaging results that were available during my care of the patient were reviewed by me and considered in my medical decision making (see chart for details).     26yo female presents with concern for erythematous lesion of back after recent return from [place].  Patient tachycardic to 130s on arrival to the ED.  Given recent travel, significant tachycardia, pain radiating to the chest, PE study done with thoracic reformats to further evaluate area of cellulitis vs abscess and assess extension..  PE study negative. No sign of abscess area more concerning for cellulitis. US and Needle used superficially to assess for possible abscess not detected on CT without aspiration of fluid.  Patient initially tachycardic with lactic acid of near 4. Given fluids, vancomycin and zosyn.  Repeat lactic acid 1.7. Hyperglycemia may be secondary to infection. Recommend PCP follow up.  Vital signs significantly improved. Pt appropriate for outpatient follow up of cellulitis at this time. Given rx for clindamycin, discussed reasons to return in detail.   Patient discharged in stable condition with understanding of reasons to return.   Final Clinical Impressions(s) / ED Diagnoses   Final diagnoses:  Cellulitis of back except buttock    ED Discharge Orders        Ordered    clindamycin (CLEOCIN) 150 MG capsule  3 times daily     04/28/17 1403       Alvira MondaySchlossman, Debroh Sieloff, MD 04/28/17 2243

## 2017-04-28 NOTE — ED Notes (Signed)
Patient transported to CT 

## 2017-04-29 NOTE — ED Provider Notes (Signed)
Discussed hyperglycemia with pt.  May be secondary to inflammatory response/infection, however cannot rule out DM at this time. Recommend recheck glucose.  If they are persistently elevated this week above 200, I have called in rx for metformin to her outpatient pharmacy until she is able to see PCP.    Lynn MondaySchlossman, Lynn Shatz, MD 04/29/17 [phone]

## 2017-11-03 ENCOUNTER — Ambulatory Visit: Payer: Self-pay | Admitting: Family Medicine

## 2017-11-03 ENCOUNTER — Encounter: Payer: Self-pay | Admitting: Family Medicine

## 2017-11-03 VITALS — BP 140/80 | HR 88 | Temp 98.1°F | Ht 64.0 in | Wt 161.6 lb

## 2017-11-03 DIAGNOSIS — Z111 Encounter for screening for respiratory tuberculosis: Secondary | ICD-10-CM

## 2017-11-03 DIAGNOSIS — Z0000 Encounter for general adult medical examination without abnormal findings: Secondary | ICD-10-CM

## 2017-11-03 NOTE — Progress Notes (Signed)
Lynn Torres is a 27 y.o. 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  Review of Systems  Constitutional: Negative for chills, fever and malaise/fatigue.  HENT: Negative for congestion, ear discharge, ear pain, sinus pain and sore throat.   Eyes: Negative.   Respiratory: Negative for cough, sputum production and shortness of breath.   Cardiovascular: Negative.  Negative for chest pain.  Gastrointestinal: Negative for abdominal pain, diarrhea, nausea and vomiting.  Genitourinary: Negative for dysuria, frequency, hematuria and urgency.  Musculoskeletal: Negative for myalgias.  Skin: Negative.   Neurological: Negative for headaches.  Endo/Heme/Allergies: Negative.   Psychiatric/Behavioral: Negative.     O: Vitals:   11/03/17 1933  BP: 140/80  Pulse: 88  Temp: 98.1 F (36.7 C)  SpO2: 98%     Physical Exam  Constitutional: She is oriented to person, place, and time. Vital signs are normal. She appears well-developed and well-nourished. She is active.  Non-toxic appearance. She does not have a sickly appearance.  HENT:  Head: Normocephalic.  Right Ear: Hearing, tympanic membrane, external ear and ear canal normal.  Left Ear: Hearing, tympanic membrane, external ear and ear canal normal.  Nose: Nose normal.  Mouth/Throat: Uvula is midline and oropharynx is clear and moist.  Neck: Normal range of motion. Neck supple.  Cardiovascular: Normal rate, regular rhythm, normal heart sounds and normal pulses.  Pulmonary/Chest: Effort normal and breath sounds normal.  Abdominal: Soft. Bowel sounds are normal.  Musculoskeletal: Normal range of motion.  Lymphadenopathy:       Head (right side): No submental and no submandibular adenopathy present.       Head (left side): No submental and no submandibular adenopathy present.    She has no cervical adenopathy.  Neurological: She is alert and oriented to person, place, and time.  Psychiatric: She  has a normal mood and affect.  Vitals reviewed.  A: 1. Physical exam    P: Exam findings, diagnosis etiology and medication use and indications reviewed with patient. Follow- Up and discharge instructions provided. No emergent/urgent issues found on exam.  Patient verbalized understanding of information provided and agrees with plan of care (POC), all questions answered.  1. Physical exam WNL- immunizations verified and signed all forms filled out and returned and signed copies scanned into record. 2. Screening-pulmonary TB - QuantiFERON-TB Gold Plus; Future

## 2017-11-03 NOTE — Patient Instructions (Signed)

## 2019-02-10 ENCOUNTER — Other Ambulatory Visit: Payer: Self-pay

## 2019-02-10 DIAGNOSIS — Z20822 Contact with and (suspected) exposure to covid-19: Secondary | ICD-10-CM

## 2019-02-11 LAB — NOVEL CORONAVIRUS, NAA: SARS-CoV-2, NAA: NOT DETECTED

## 2019-07-05 IMAGING — CT CT T SPINE W/O CM
2 of 12 series · 7 of 33 positions shown, 8 images · IV contrast (ISOVUE 370)
Comparison: 100 mL Nsovue-8BB

CLINICAL DATA: Left upper posterior thoracic pain.

EXAM:
CT THORACIC SPINE WITHOUT CONTRAST
TECHNIQUE: Multidetector CT images of the thoracic were obtained using the
standard protocol without intravenous contrast.

[Series 6: thins · axial · 0.70mm/px · z∈[+1374,+1518]mm · 4 of 242 slices shown, 5 images]
[im 49/242  soft-tissue]
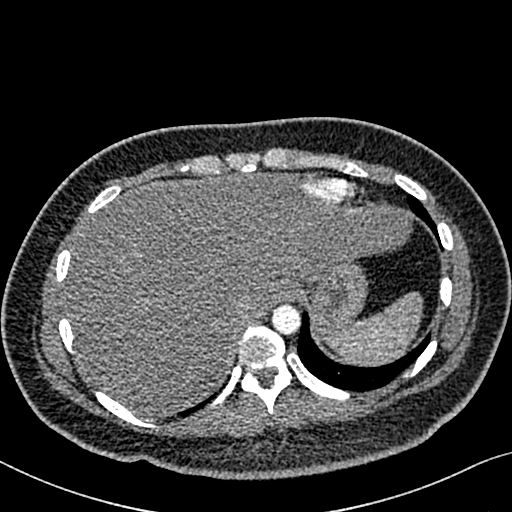
[im 49/242  bone]
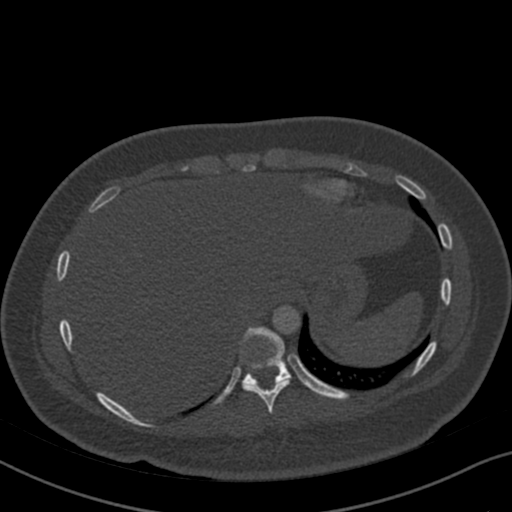
[im 97/242  bone]
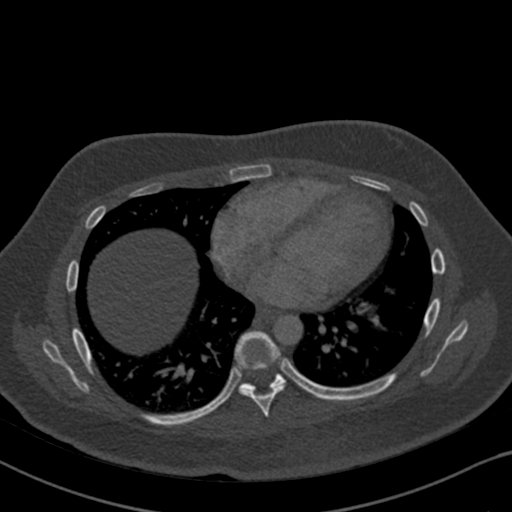
[im 145/242  bone]
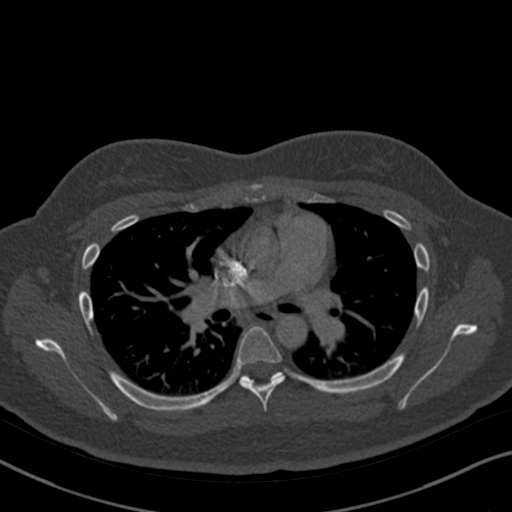
[im 193/242  bone]
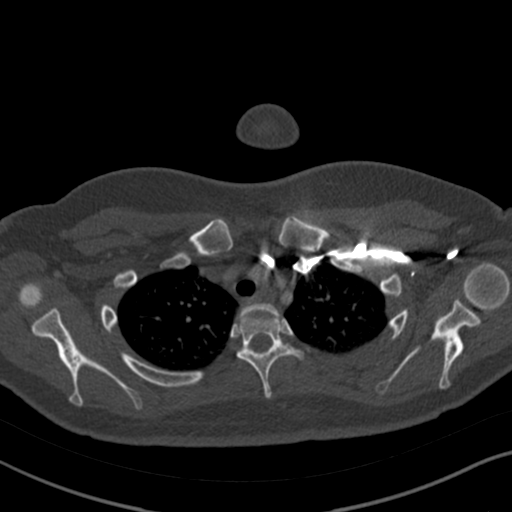

[Series 10: sagittal mpr · sagittal · 0.49mm/px · 3 of 151 slices shown]
[im 38/151  bone]
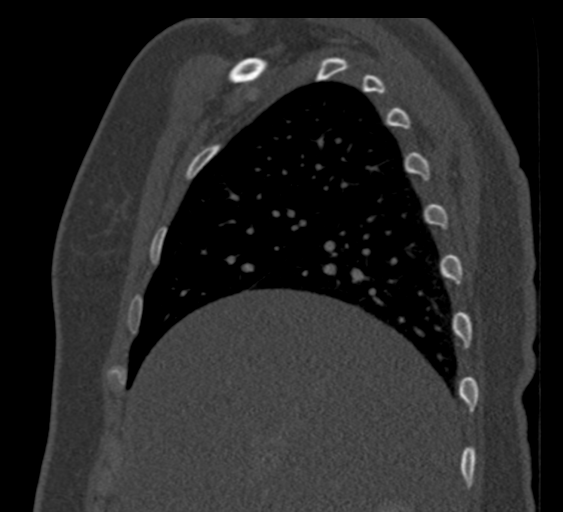
[im 76/151  bone]
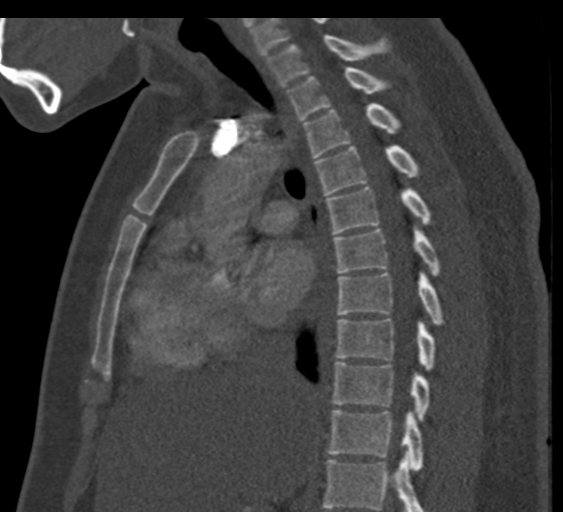
[im 113/151  bone]
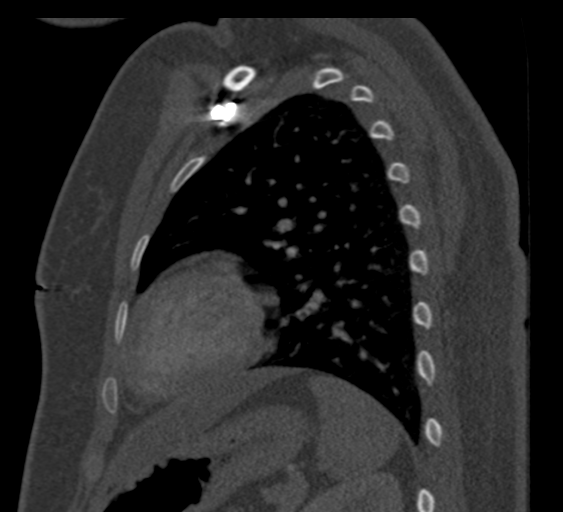

[7 of 33 positions shown; findings below may reference images not displayed]

FINDINGS: Alignment: Normal.

Vertebrae: No acute fracture or focal pathologic process.

Paraspinal and other soft tissues: Negative.

Disc levels: Disc spaces are maintained. No foraminal or central
canal stenosis.

Soft tissue: Soft tissue edema in the subcutaneous fat of the back
at the level of the midthoracic spine with overlying skin
thickening. No focal fluid collection to suggest an abscess.
IMPRESSION: 1.  No acute osseous injury of the thoracic spine.
2. Cellulitis of the back at the level of the midthoracic spine. No
focal fluid collection to suggest an abscess.

## 2021-07-20 ENCOUNTER — Other Ambulatory Visit (HOSPITAL_COMMUNITY): Payer: Self-pay

## 2021-07-20 MED ORDER — NORETHIN ACE-ETH ESTRAD-FE 1-20 MG-MCG PO TABS
ORAL_TABLET | ORAL | 10 refills | Status: DC
  Filled 2021-07-20 – 2021-08-02 (×2): qty 84, 84d supply, fill #0
  Filled 2021-10-26: qty 84, 84d supply, fill #1
  Filled 2022-01-09: qty 84, 84d supply, fill #2
  Filled 2022-04-15: qty 56, 56d supply, fill #3

## 2021-08-02 ENCOUNTER — Other Ambulatory Visit (HOSPITAL_COMMUNITY): Payer: Self-pay

## 2021-08-03 ENCOUNTER — Other Ambulatory Visit (HOSPITAL_COMMUNITY): Payer: Self-pay

## 2021-08-10 ENCOUNTER — Other Ambulatory Visit (HOSPITAL_COMMUNITY): Payer: Self-pay

## 2021-10-26 ENCOUNTER — Other Ambulatory Visit (HOSPITAL_COMMUNITY): Payer: Self-pay

## 2022-01-17 ENCOUNTER — Other Ambulatory Visit (HOSPITAL_COMMUNITY): Payer: Self-pay

## 2022-01-18 ENCOUNTER — Other Ambulatory Visit (HOSPITAL_COMMUNITY): Payer: Self-pay

## 2022-04-15 ENCOUNTER — Other Ambulatory Visit (HOSPITAL_COMMUNITY): Payer: Self-pay

## 2022-06-06 ENCOUNTER — Other Ambulatory Visit (HOSPITAL_COMMUNITY): Payer: Self-pay

## 2022-06-06 MED ORDER — AZITHROMYCIN 500 MG PO TABS
ORAL_TABLET | ORAL | 0 refills | Status: DC
  Filled 2022-06-06: qty 8, 4d supply, fill #0

## 2022-06-07 ENCOUNTER — Other Ambulatory Visit (HOSPITAL_COMMUNITY): Payer: Self-pay

## 2022-07-16 ENCOUNTER — Other Ambulatory Visit (HOSPITAL_COMMUNITY): Payer: Self-pay

## 2022-07-16 MED ORDER — NORETHIN ACE-ETH ESTRAD-FE 1-20 MG-MCG PO TABS
1.0000 | ORAL_TABLET | Freq: Every day | ORAL | 12 refills | Status: DC
  Filled 2022-07-16: qty 28, 28d supply, fill #0
  Filled 2022-08-14: qty 28, 28d supply, fill #1
  Filled 2022-09-09: qty 28, 28d supply, fill #2
  Filled 2022-10-08: qty 28, 28d supply, fill #3
  Filled 2022-11-04: qty 28, 28d supply, fill #4
  Filled 2022-12-17: qty 28, 28d supply, fill #5
  Filled 2023-03-10: qty 28, 28d supply, fill #6

## 2022-07-29 ENCOUNTER — Other Ambulatory Visit: Payer: Self-pay

## 2022-08-14 ENCOUNTER — Other Ambulatory Visit (HOSPITAL_COMMUNITY): Payer: Self-pay

## 2022-08-17 ENCOUNTER — Other Ambulatory Visit (HOSPITAL_COMMUNITY): Payer: Self-pay

## 2022-09-03 ENCOUNTER — Other Ambulatory Visit (HOSPITAL_COMMUNITY): Payer: Self-pay

## 2022-10-08 ENCOUNTER — Other Ambulatory Visit: Payer: Self-pay

## 2022-12-17 ENCOUNTER — Ambulatory Visit: Payer: Self-pay

## 2022-12-17 ENCOUNTER — Other Ambulatory Visit: Payer: Self-pay | Admitting: Family Medicine

## 2022-12-17 DIAGNOSIS — M79642 Pain in left hand: Secondary | ICD-10-CM

## 2023-07-18 ENCOUNTER — Other Ambulatory Visit (HOSPITAL_COMMUNITY): Payer: Self-pay

## 2023-07-18 MED ORDER — AMOXICILLIN-POT CLAVULANATE 875-125 MG PO TABS
1.0000 | ORAL_TABLET | Freq: Two times a day (BID) | ORAL | 0 refills | Status: DC
  Filled 2023-07-18: qty 14, 7d supply, fill #0

## 2023-08-28 ENCOUNTER — Other Ambulatory Visit (HOSPITAL_COMMUNITY): Payer: Self-pay

## 2023-09-09 ENCOUNTER — Ambulatory Visit: Payer: Self-pay | Admitting: Family Medicine

## 2023-09-09 ENCOUNTER — Encounter: Payer: Self-pay | Admitting: Family Medicine

## 2023-09-09 ENCOUNTER — Other Ambulatory Visit (HOSPITAL_COMMUNITY): Payer: Self-pay

## 2023-09-09 VITALS — BP 155/100 | HR 110 | Temp 98.0°F | Ht 64.0 in | Wt 155.8 lb

## 2023-09-09 DIAGNOSIS — R030 Elevated blood-pressure reading, without diagnosis of hypertension: Secondary | ICD-10-CM | POA: Diagnosis not present

## 2023-09-09 DIAGNOSIS — Z7689 Persons encountering health services in other specified circumstances: Secondary | ICD-10-CM

## 2023-09-09 DIAGNOSIS — F418 Other specified anxiety disorders: Secondary | ICD-10-CM

## 2023-09-09 MED ORDER — BUPROPION HCL ER (XL) 150 MG PO TB24
150.0000 mg | ORAL_TABLET | Freq: Every day | ORAL | 1 refills | Status: DC
  Filled 2023-09-09: qty 30, 30d supply, fill #0

## 2023-09-09 NOTE — Progress Notes (Signed)
 New Patient Office Visit  Subjective    Patient ID: Lynn Torres, female    DOB: 03/21/1991  Age: 33 y.o. MRN: [id number]  CC:  Chief Complaint  Patient presents with   Establish Care    HPI Lynn Torres presents to establish care.  Pt is new to me.   Pt reports family hx of diabetes, HTN, and HLD.  She would like to start back on anxiety medicines. She was on Sertraline  for 3 months and stopped. She was on this in 2017 due to a miscarriage. She feels like now, crowds make her anxious more than it use to. 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  Flowsheet Row Office Visit from 09/09/2023 in [facility]  PHQ-9 Total Score 5          09/09/2023    3:34 PM 04/03/2016    9:43 AM  GAD 7 : Generalized Anxiety Score  Nervous, Anxious, on Edge 2 2  Control/stop worrying 1 2  Worry too much - different things 2 2  Trouble relaxing 2 2  Restless 1 2  Easily annoyed or irritable 2 2  Afraid - awful might happen 1 1  Total GAD 7 Score 11 13  Anxiety Difficulty Somewhat difficult    Last pap 2017 Last CPE at [facility] in 2021.   Hypertension Pt has been checking bp's at home which has been 130/80s. She is anxious today. She has hx of white coat syndrome.   Outpatient Encounter Medications as of 09/09/2023  Medication Sig   amoxicillin -clavulanate (AUGMENTIN ) 875-125 MG tablet Take 1 tablet by mouth 2 (two) times daily. (Patient not taking: Reported on 09/09/2023)   azithromycin  (ZITHROMAX ) 500 MG tablet Take as needed for travel diarrhea as directed (Patient not taking: Reported on 09/09/2023)   hydrOXYzine  (VISTARIL ) 25 MG capsule Take 1 capsule (25 mg total) by mouth 3 (three) times daily as needed for anxiety (sleep). (Patient not taking: Reported on 04/28/2017)   ibuprofen  (ADVIL ,MOTRIN ) 200 MG tablet Take 400 mg by mouth every 6 (six) hours as needed for mild pain. (Patient not taking: Reported on 09/09/2023)   norethindrone -ethinyl  estradiol -FE (JUNEL  FE 1/20) 1-20 MG-MCG tablet Take 1 tablet by mouth once daily at the same time each day. (Patient not taking: Reported on 09/09/2023)   norethindrone -ethinyl estradiol -FE (JUNEL  FE 1/20) 1-20 MG-MCG tablet Take 1 tablet by mouth daily at the same time. (Patient not taking: Reported on 09/09/2023)   norgestimate -ethinyl estradiol  (ORTHO-CYCLEN,SPRINTEC,PREVIFEM) 0.25-35 MG-MCG tablet Take 1 tablet by mouth daily. (Patient not taking: Reported on 04/28/2017)   oxyCODONE -acetaminophen  (PERCOCET/ROXICET) 5-325 MG tablet Take 1-2 tablets by mouth every 4 (four) hours as needed for severe pain. (Patient not taking: Reported on 04/28/2017)   sertraline  (ZOLOFT ) 25 MG tablet Take 1 tablet (25 mg total) by mouth at bedtime. (Patient not taking: Reported on 04/28/2017)   No facility-administered encounter medications on file as of 09/09/2023.    History reviewed. No pertinent past medical history.  History reviewed. No pertinent surgical history.  History reviewed. No pertinent family history.  Social History   Socioeconomic History   Marital status: Married    Spouse name: Not on file   Number of children: Not on file   Years of education: Not on file   Highest education level: Master's degree (e.g., MA, MS, MEng, MEd, MSW, MBA)  Occupational History   Not on file  Tobacco Use   Smoking status: Never  Smokeless tobacco: Never  Substance and Sexual Activity   Alcohol use: No   Drug use: No   Sexual activity: Yes    Birth control/protection: None  Other Topics Concern   Not on file  Social History Narrative   Not on file   Social Drivers of Health   Financial Resource Strain: Low Risk  (09/07/2023)   Overall Financial Resource Strain (CARDIA)    Difficulty of Paying Living Expenses: Not hard at all  Food Insecurity: No Food Insecurity (09/07/2023)   Hunger Vital Sign    Worried About Running Out of Food in the Last Year: Never true    Ran Out of Food in the Last Year:  Never true  Transportation Needs: No Transportation Needs (09/07/2023)   PRAPARE - Administrator, Civil Service (Medical): No    Lack of Transportation (Non-Medical): No  Physical Activity: Sufficiently Active (09/07/2023)   Exercise Vital Sign    Days of Exercise per Week: 5 days    Minutes of Exercise per Session: 30 min  Stress: Stress Concern Present (09/07/2023)   [employer] of Occupational Health - Occupational Stress Questionnaire    Feeling of Stress : Very much  Social Connections: Moderately Integrated (09/07/2023)   Social Connection and Isolation Panel [NHANES]    Frequency of Communication with Friends and Family: More than three times a week    Frequency of Social Gatherings with Friends and Family: More than three times a week    Attends Religious Services: 1 to 4 times per year    Active Member of [employer] or Organizations: No    Attends Engineer, structural: Not on file    Marital Status: Married  Catering manager Violence: Not on file    Review of Systems  Psychiatric/Behavioral:  Positive for depression. The patient is nervous/anxious.        Objective    BP (!) 163/105 (BP Location: Left Arm, Patient Position: Sitting, Cuff Size: Normal)   Pulse (!) 110   Temp 98 F (36.7 C) (Oral)   Ht 5\' 4"  (1.626 m)   Wt 155 lb 12.8 oz (70.7 kg)   LMP  (LMP Unknown)   SpO2 99%   BMI 26.74 kg/m   Physical Exam Vitals and nursing note reviewed.  Constitutional:      Appearance: Normal appearance. She is normal weight.  HENT:     Head: Normocephalic and atraumatic.     Right Ear: External ear normal.     Left Ear: External ear normal.     Nose: Nose normal.     Mouth/Throat:     Mouth: Mucous membranes are moist.     Pharynx: Oropharynx is clear.  Eyes:     Conjunctiva/sclera: Conjunctivae normal.     Pupils: Pupils are equal, round, and reactive to light.  Cardiovascular:     Rate and Rhythm: Regular rhythm. Tachycardia present.      Pulses: Normal pulses.     Heart sounds: Normal heart sounds.  Pulmonary:     Effort: Pulmonary effort is normal.     Breath sounds: Normal breath sounds.  Abdominal:     General: Abdomen is flat. Bowel sounds are normal.  Skin:    General: Skin is warm.     Capillary Refill: Capillary refill takes less than 2 seconds.  Neurological:     General: No focal deficit present.     Mental Status: She is alert and oriented to person, place, and time. Mental  status is at baseline.  Psychiatric:        Mood and Affect: Mood normal.        Behavior: Behavior normal.        Thought Content: Thought content normal.        Judgment: Judgment normal.       Assessment & Plan:   Problem List Items Addressed This Visit   None Encounter to establish care with new doctor  Elevated blood pressure reading in office with white coat syndrome, without diagnosis of hypertension  Depression with anxiety -     buPROPion HCl ER (XL); Take 1 tablet (150 mg total) by mouth daily.  Dispense: 30 tablet; Refill: 1   Pt with hx of depression/anxiety. Use to be on medicine after her miscarriage. Feels she needs to restart medicine. Trial of Wellbutrin 150mg  daily. See back in 4 weeks. She also is noted to have elevated BP today. She checks at work and has been normal. She does report hx of white coat syndrome. Advised to monitor outside the office and bring log back in 4 weeks.   No follow-ups on file.   Manette Section, MD

## 2023-10-08 ENCOUNTER — Ambulatory Visit: Admitting: Family Medicine

## 2023-10-08 ENCOUNTER — Other Ambulatory Visit (HOSPITAL_COMMUNITY): Payer: Self-pay

## 2023-10-08 ENCOUNTER — Encounter: Payer: Self-pay | Admitting: Family Medicine

## 2023-10-08 VITALS — BP 138/86 | HR 96 | Temp 98.1°F | Resp 18 | Ht 64.0 in | Wt 158.3 lb

## 2023-10-08 DIAGNOSIS — R030 Elevated blood-pressure reading, without diagnosis of hypertension: Secondary | ICD-10-CM | POA: Diagnosis not present

## 2023-10-08 DIAGNOSIS — F418 Other specified anxiety disorders: Secondary | ICD-10-CM

## 2023-10-08 MED ORDER — BUPROPION HCL ER (XL) 150 MG PO TB24
150.0000 mg | ORAL_TABLET | Freq: Every day | ORAL | 1 refills | Status: DC
  Filled 2023-10-08: qty 90, 90d supply, fill #0

## 2023-10-08 NOTE — Progress Notes (Signed)
 Established Patient Office Visit  Subjective   Patient ID: Lynn Torres, female    DOB: Jan 03, 1991  Age: 33 y.o. MRN: [id number]  Chief Complaint  Patient presents with   Follow-up    Patient is here for a 4 week follow up     HPI  Depression/anxiety Pt reported increased in depression/anxiety. She was started on Wellbutrin  150 mg daily 4 weeks ago. She says her anxiety and mood has improved. Needs this refilled. Flowsheet Row Office Visit from 10/08/2023 in [facility]  PHQ-9 Total Score 3          10/08/2023    4:03 PM 09/09/2023    3:34 PM 04/03/2016    9:43 AM  GAD 7 : Generalized Anxiety Score  Nervous, Anxious, on Edge 1 2 2   Control/stop worrying 1 1 2   Worry too much - different things 0 2 2  Trouble relaxing 0 2 2  Restless 0 1 2  Easily annoyed or irritable 1 2 2   Afraid - awful might happen 0 1 1  Total GAD 7 Score 3 11 13   Anxiety Difficulty  Somewhat difficult      Elevated blood pressure Pt here for blood pressure review. Had elevated readings during first appt. Reported hx of white coat syndrome. Been checking at home and averaging 130-140s SBP. Some values are above 90 DBP but not many.  Review of Systems  All other systems reviewed and are negative.    Objective:     BP 138/86   Pulse 96   Temp 98.1 F (36.7 C) (Oral)   Resp 18   Ht 5\' 4"  (1.626 m)   Wt 158 lb 4.8 oz (71.8 kg)   LMP  (LMP Unknown)   SpO2 100%   BMI 27.17 kg/m  BP Readings from Last 3 Encounters:  10/08/23 138/86  09/09/23 (!) 155/100  11/03/17 140/80      Physical Exam Vitals and nursing note reviewed.  Constitutional:      Appearance: Normal appearance. She is normal weight.  HENT:     Head: Normocephalic and atraumatic.     Right Ear: External ear normal.     Left Ear: External ear normal.     Nose: Nose normal.     Mouth/Throat:     Mouth: Mucous membranes are moist.     Pharynx: Oropharynx is clear.  Eyes:      Conjunctiva/sclera: Conjunctivae normal.     Pupils: Pupils are equal, round, and reactive to light.  Cardiovascular:     Rate and Rhythm: Normal rate.  Pulmonary:     Effort: Pulmonary effort is normal.  Skin:    General: Skin is warm.     Capillary Refill: Capillary refill takes less than 2 seconds.  Neurological:     General: No focal deficit present.     Mental Status: She is alert and oriented to person, place, and time. Mental status is at baseline.  Psychiatric:        Mood and Affect: Mood normal.        Behavior: Behavior normal.        Thought Content: Thought content normal.        Judgment: Judgment normal.    No results found for any visits on 10/08/23.     The ASCVD Risk score (Arnett DK, et al., 2019) failed to calculate for the following reasons:   The 2019 ASCVD risk score is only valid for ages  40 to 79    Assessment & Plan:   Problem List Items Addressed This Visit   None Visit Diagnoses       Depression with anxiety    -  Primary   Relevant Medications   buPROPion  (WELLBUTRIN  XL) 150 MG 24 hr tablet     Elevated blood pressure reading in office with white coat syndrome, without diagnosis of hypertension         Depression with anxiety -     buPROPion  HCl ER (XL); Take 1 tablet (150 mg total) by mouth daily.  Dispense: 90 tablet; Refill: 1  Elevated blood pressure reading in office with white coat syndrome, without diagnosis of hypertension   Pt with improvement in mood and anxiety with Wellbutrin  150mg  daily. To refill this. To continue to monitor blood pressures outside the office.   Return for Annual Physical.    Manette Section, MD

## 2023-10-30 ENCOUNTER — Encounter: Payer: Self-pay | Admitting: Family Medicine

## 2023-11-05 ENCOUNTER — Encounter: Payer: Self-pay | Admitting: Family Medicine

## 2023-11-17 DIAGNOSIS — Z3201 Encounter for pregnancy test, result positive: Secondary | ICD-10-CM | POA: Diagnosis not present

## 2023-12-10 ENCOUNTER — Other Ambulatory Visit (HOSPITAL_COMMUNITY): Payer: Self-pay

## 2023-12-10 ENCOUNTER — Encounter: Payer: Self-pay | Admitting: Family Medicine

## 2023-12-10 DIAGNOSIS — Z1272 Encounter for screening for malignant neoplasm of vagina: Secondary | ICD-10-CM | POA: Diagnosis not present

## 2023-12-10 DIAGNOSIS — Z3481 Encounter for supervision of other normal pregnancy, first trimester: Secondary | ICD-10-CM | POA: Diagnosis not present

## 2023-12-10 DIAGNOSIS — Z113 Encounter for screening for infections with a predominantly sexual mode of transmission: Secondary | ICD-10-CM | POA: Diagnosis not present

## 2023-12-10 DIAGNOSIS — Z3689 Encounter for other specified antenatal screening: Secondary | ICD-10-CM | POA: Diagnosis not present

## 2023-12-10 DIAGNOSIS — Z3A09 9 weeks gestation of pregnancy: Secondary | ICD-10-CM | POA: Diagnosis not present

## 2023-12-10 DIAGNOSIS — O26891 Other specified pregnancy related conditions, first trimester: Secondary | ICD-10-CM | POA: Diagnosis not present

## 2023-12-10 DIAGNOSIS — Z363 Encounter for antenatal screening for malformations: Secondary | ICD-10-CM | POA: Diagnosis not present

## 2023-12-10 DIAGNOSIS — Z368A Encounter for antenatal screening for other genetic defects: Secondary | ICD-10-CM | POA: Diagnosis not present

## 2023-12-10 DIAGNOSIS — O10911 Unspecified pre-existing hypertension complicating pregnancy, first trimester: Secondary | ICD-10-CM | POA: Diagnosis not present

## 2023-12-10 DIAGNOSIS — Z124 Encounter for screening for malignant neoplasm of cervix: Secondary | ICD-10-CM | POA: Diagnosis not present

## 2023-12-10 DIAGNOSIS — Z3482 Encounter for supervision of other normal pregnancy, second trimester: Secondary | ICD-10-CM | POA: Diagnosis not present

## 2023-12-10 DIAGNOSIS — Z1331 Encounter for screening for depression: Secondary | ICD-10-CM | POA: Diagnosis not present

## 2023-12-10 DIAGNOSIS — Z0142 Encounter for cervical smear to confirm findings of recent normal smear following initial abnormal smear: Secondary | ICD-10-CM | POA: Diagnosis not present

## 2023-12-10 DIAGNOSIS — Z1151 Encounter for screening for human papillomavirus (HPV): Secondary | ICD-10-CM | POA: Diagnosis not present

## 2023-12-10 DIAGNOSIS — Z3491 Encounter for supervision of normal pregnancy, unspecified, first trimester: Secondary | ICD-10-CM | POA: Diagnosis not present

## 2023-12-10 MED ORDER — SERTRALINE HCL 50 MG PO TABS
50.0000 mg | ORAL_TABLET | Freq: Every day | ORAL | 4 refills | Status: AC
  Filled 2023-12-10: qty 30, 30d supply, fill #0
  Filled 2024-01-06: qty 30, 30d supply, fill #1
  Filled 2024-01-26 – 2024-01-30 (×3): qty 30, 30d supply, fill #2
  Filled 2024-03-11: qty 30, 30d supply, fill #3

## 2023-12-11 ENCOUNTER — Encounter: Payer: Self-pay | Admitting: Family Medicine

## 2023-12-11 ENCOUNTER — Ambulatory Visit (INDEPENDENT_AMBULATORY_CARE_PROVIDER_SITE_OTHER): Admitting: Family Medicine

## 2023-12-11 VITALS — BP 128/84 | HR 91 | Temp 98.6°F | Ht 64.0 in | Wt 163.0 lb

## 2023-12-11 DIAGNOSIS — Z136 Encounter for screening for cardiovascular disorders: Secondary | ICD-10-CM | POA: Diagnosis not present

## 2023-12-11 DIAGNOSIS — Z13220 Encounter for screening for lipoid disorders: Secondary | ICD-10-CM

## 2023-12-11 DIAGNOSIS — Z0000 Encounter for general adult medical examination without abnormal findings: Secondary | ICD-10-CM

## 2023-12-11 DIAGNOSIS — R7302 Impaired glucose tolerance (oral): Secondary | ICD-10-CM

## 2023-12-11 NOTE — Progress Notes (Signed)
 Complete physical exam  Patient: Lynn Torres   DOB: 1991/01/05   33 y.o. Female  MRN: [id number]  Subjective:    Chief Complaint  Patient presents with   Annual Exam    Lynn Torres is a 33 y.o. female who presents today for a complete physical exam. She reports consuming a low sodium diet. Yoga and walking She generally feels well. She reports sleeping fairly well. She does not have additional problems to discuss today.    Most recent fall risk assessment:    09/09/2023    3:33 PM  Fall Risk   Falls in the past year? 0  Number falls in past yr: 0  Injury with Fall? 0  Risk for fall due to : No Fall Risks  Follow up Falls evaluation completed     Most recent depression screenings:    10/08/2023    4:02 PM 09/09/2023    3:34 PM  PHQ 2/9 Scores  PHQ - 2 Score 1 1  PHQ- 9 Score 3 5    Vision:Within last year  Patient Active Problem List   Diagnosis Date Noted   Symptoms of depression 04/03/2016   Surveillance for birth control, oral contraceptives 04/03/2016   Follow-up visit after miscarriage 04/03/2016   Pap test, as part of routine gynecological examination 04/03/2016   Past Medical History:  Diagnosis Date   Allergy    Pollen, pet dander, dust   Anxiety    Depression    No past surgical history on file. Social History   Socioeconomic History   Marital status: Married    Spouse name: Not on file   Number of children: Not on file   Years of education: Not on file   Highest education level: Master's degree (e.g., MA, MS, MEng, MEd, MSW, MBA)  Occupational History   Not on file  Tobacco Use   Smoking status: Never   Smokeless tobacco: Never  Substance and Sexual Activity   Alcohol use: Not Currently    Alcohol/week: 6.0 standard drinks of alcohol   Drug use: No   Sexual activity: Yes    Birth control/protection: Other-see comments, None    Comment: Currently pregnant  Other Topics Concern   Not on file  Social History Narrative   Not on file    Social Drivers of Health   Financial Resource Strain: Low Risk  (12/04/2023)   Overall Financial Resource Strain (CARDIA)    Difficulty of Paying Living Expenses: Not hard at all  Food Insecurity: No Food Insecurity (12/04/2023)   Hunger Vital Sign    Worried About Running Out of Food in the Last Year: Never true    Ran Out of Food in the Last Year: Never true  Transportation Needs: No Transportation Needs (12/04/2023)   PRAPARE - Administrator, Civil Service (Medical): No    Lack of Transportation (Non-Medical): No  Physical Activity: Sufficiently Active (12/04/2023)   Exercise Vital Sign    Days of Exercise per Week: 6 days    Minutes of Exercise per Session: 40 min  Stress: Stress Concern Present (12/04/2023)   [employer] of Occupational Health - Occupational Stress Questionnaire    Feeling of Stress: Rather much  Social Connections: Moderately Isolated (12/04/2023)   Social Connection and Isolation Panel    Frequency of Communication with Friends and Family: Twice a week    Frequency of Social Gatherings with Friends and Family: Three times a week    Attends Religious Services: Never  Active Member of Clubs or Organizations: No    Attends Engineer, structural: Not on file    Marital Status: Married  Catering manager Violence: Not on file   Family History  Problem Relation Age of Onset   Diabetes Mother    Hyperlipidemia Mother    Hypertension Mother    Miscarriages / [place] Mother    Diabetes Father    Hyperlipidemia Father    Hypertension Father    Allergies  Allergen Reactions   Bee Pollen Other (See Comments)   Cat Dander Other (See Comments)      Patient Care Team: Colette Torrence GRADE, MD as PCP - General (Family Medicine)   Outpatient Medications Prior to Visit  Medication Sig   buPROPion  (WELLBUTRIN  XL) 150 MG 24 hr tablet Take 1 tablet (150 mg total) by mouth daily.   sertraline  (ZOLOFT ) 50 MG tablet Take 1 tablet (50 mg  total) by mouth daily.   No facility-administered medications prior to visit.    Review of Systems  All other systems reviewed and are negative.         Objective:     BP (!) 157/90   Pulse 91   Temp 98.6 F (37 C) (Oral)   Ht 5' 4 (1.626 m)   Wt 163 lb (73.9 kg)   SpO2 99%   BMI 27.98 kg/m  BP Readings from Last 3 Encounters:  12/11/23 128/84  10/08/23 138/86  09/09/23 (!) 155/100      Physical Exam Vitals and nursing note reviewed.  Constitutional:      Appearance: Normal appearance. She is normal weight.  HENT:     Head: Normocephalic and atraumatic.     Right Ear: Tympanic membrane, ear canal and external ear normal.     Left Ear: Tympanic membrane, ear canal and external ear normal.     Nose: Nose normal.     Mouth/Throat:     Mouth: Mucous membranes are moist.     Pharynx: Oropharynx is clear.  Eyes:     Conjunctiva/sclera: Conjunctivae normal.     Pupils: Pupils are equal, round, and reactive to light.  Cardiovascular:     Rate and Rhythm: Normal rate and regular rhythm.     Pulses: Normal pulses.     Heart sounds: Normal heart sounds.  Pulmonary:     Effort: Pulmonary effort is normal.     Breath sounds: Normal breath sounds.  Abdominal:     General: Bowel sounds are normal.     Comments: gravid  Skin:    General: Skin is warm.     Capillary Refill: Capillary refill takes less than 2 seconds.  Neurological:     General: No focal deficit present.     Mental Status: She is alert and oriented to person, place, and time. Mental status is at baseline.  Psychiatric:        Mood and Affect: Mood normal.        Behavior: Behavior normal.        Thought Content: Thought content normal.        Judgment: Judgment normal.     No results found for any visits on 12/11/23. Last CBC Lab Results  Component Value Date   WBC 13.7 (H) 04/28/2017   HGB 15.1 (H) 04/28/2017   HCT 44.1 04/28/2017   MCV 90.6 04/28/2017   MCH 31.0 04/28/2017   RDW 11.9  04/28/2017   PLT 343 04/28/2017   Last metabolic panel Lab Results  Component Value Date   GLUCOSE 418 (H) 04/28/2017   NA 131 (L) 04/28/2017   K 3.7 04/28/2017   CL 92 (L) 04/28/2017   CO2 25 04/28/2017   BUN 8 04/28/2017   CREATININE 0.57 04/28/2017   GFRNONAA >60 04/28/2017   CALCIUM 9.8 04/28/2017   PROT 8.4 (H) 04/28/2017   ALBUMIN 4.3 04/28/2017   BILITOT 0.8 04/28/2017   ALKPHOS 48 04/28/2017   AST 31 04/28/2017   ALT 51 04/28/2017   ANIONGAP 14 04/28/2017   Last lipids No results found for: CHOL, HDL, LDLCALC, LDLDIRECT, TRIG, CHOLHDL Last hemoglobin A1c No results found for: HGBA1C      Assessment & Plan:    Routine Health Maintenance and Physical Exam  Immunization History  Administered Date(s) Administered   Influenza-Unspecified 03/05/2023   PFIZER(Purple Top)SARS-COV-2 Vaccination 05/14/2019, 06/08/2019, 02/08/2020   Pfizer Covid-19 Vaccine Bivalent Booster 21yrs & up 01/15/2021    Health Maintenance  Topic Date Due   HIV Screening  Never done   Hepatitis C Screening  Never done   DTaP/Tdap/Td (1 - Tdap) Never done   Hepatitis B Vaccines (1 of 3 - 19+ 3-dose series) Never done   HPV VACCINES (1 - 3-dose SCDM series) Never done   Cervical Cancer Screening (HPV/Pap Cotest)  08/10/2022   COVID-19 Vaccine (5 - 2024-25 season) 01/05/2023   INFLUENZA VACCINE  12/05/2023   Meningococcal B Vaccine  Aged Out    Discussed health benefits of physical activity, and encouraged her to engage in regular exercise appropriate for her age and condition.  Problem List Items Addressed This Visit   None  No follow-ups on file. Annual physical exam  Encounter for lipid screening for cardiovascular disease -     Lipid panel  Impaired glucose tolerance -     Comprehensive metabolic panel with GFR -     Hemoglobin A1c  Pt here for CPE. Screening labs today See in 1 year sooner prn.    Torrence CINDERELLA Barrier, MD

## 2023-12-12 ENCOUNTER — Ambulatory Visit: Payer: Self-pay | Admitting: Family Medicine

## 2023-12-12 LAB — LIPID PANEL
Chol/HDL Ratio: 2.2 ratio (ref 0.0–4.4)
Cholesterol, Total: 186 mg/dL (ref 100–199)
HDL: 83 mg/dL (ref >39)
LDL Chol Calc (NIH): 80 mg/dL (ref 0–99)
Triglycerides: 139 mg/dL (ref 0–149)
VLDL Cholesterol Cal: 23 mg/dL (ref 5–40)

## 2023-12-12 LAB — COMPREHENSIVE METABOLIC PANEL WITH GFR
ALT: 18 IU/L (ref 0–32)
AST: 15 IU/L (ref 0–40)
Albumin: 4.9 g/dL (ref 3.9–4.9)
Alkaline Phosphatase: 35 IU/L — ABNORMAL LOW (ref 44–121)
BUN/Creatinine Ratio: 19 (ref 9–23)
BUN: 8 mg/dL (ref 6–20)
Bilirubin Total: 0.4 mg/dL (ref 0.0–1.2)
CO2: 19 mmol/L — ABNORMAL LOW (ref 20–29)
Calcium: 10.2 mg/dL (ref 8.7–10.2)
Chloride: 97 mmol/L (ref 96–106)
Creatinine, Ser: 0.42 mg/dL — ABNORMAL LOW (ref 0.57–1.00)
Globulin, Total: 2.3 g/dL (ref 1.5–4.5)
Glucose: 128 mg/dL — ABNORMAL HIGH (ref 70–99)
Potassium: 4 mmol/L (ref 3.5–5.2)
Sodium: 134 mmol/L (ref 134–144)
Total Protein: 7.2 g/dL (ref 6.0–8.5)
eGFR: 134 mL/min/1.73 (ref >59)

## 2023-12-12 LAB — HEMOGLOBIN A1C
Est. average glucose Bld gHb Est-mCnc: 192 mg/dL
Hgb A1c MFr Bld: 8.3 % — ABNORMAL HIGH (ref 4.8–5.6)

## 2023-12-15 ENCOUNTER — Other Ambulatory Visit (HOSPITAL_COMMUNITY): Payer: Self-pay

## 2023-12-15 MED ORDER — ONETOUCH ULTRA BLUE TEST VI STRP
ORAL_STRIP | 2 refills | Status: AC
  Filled 2023-12-15: qty 100, 25d supply, fill #0
  Filled 2024-01-06: qty 100, 25d supply, fill #1
  Filled 2024-02-10: qty 100, 25d supply, fill #2

## 2023-12-15 MED ORDER — ONETOUCH ULTRA 2 W/DEVICE KIT
PACK | 0 refills | Status: AC
  Filled 2023-12-15: qty 1, 30d supply, fill #0

## 2023-12-15 MED ORDER — ONETOUCH DELICA PLUS LANCET33G MISC
2 refills | Status: AC
  Filled 2023-12-15: qty 100, 25d supply, fill #0
  Filled 2024-01-06 – 2024-01-07 (×2): qty 100, 25d supply, fill #1
  Filled 2024-02-10: qty 100, 25d supply, fill #2

## 2023-12-17 ENCOUNTER — Encounter (HOSPITAL_COMMUNITY): Payer: Self-pay | Admitting: *Deleted

## 2023-12-17 ENCOUNTER — Telehealth (HOSPITAL_COMMUNITY): Payer: Self-pay | Admitting: *Deleted

## 2023-12-17 NOTE — Telephone Encounter (Signed)
 Preadmission screen

## 2023-12-29 ENCOUNTER — Other Ambulatory Visit (HOSPITAL_COMMUNITY): Payer: Self-pay

## 2023-12-29 DIAGNOSIS — O09299 Supervision of pregnancy with other poor reproductive or obstetric history, unspecified trimester: Secondary | ICD-10-CM | POA: Diagnosis not present

## 2023-12-29 DIAGNOSIS — Z3A12 12 weeks gestation of pregnancy: Secondary | ICD-10-CM | POA: Diagnosis not present

## 2023-12-29 DIAGNOSIS — O24111 Pre-existing diabetes mellitus, type 2, in pregnancy, first trimester: Secondary | ICD-10-CM | POA: Diagnosis not present

## 2023-12-29 MED ORDER — METFORMIN HCL 500 MG PO TABS
500.0000 mg | ORAL_TABLET | Freq: Two times a day (BID) | ORAL | 1 refills | Status: AC
  Filled 2023-12-29: qty 60, 30d supply, fill #0
  Filled 2024-03-11: qty 60, 30d supply, fill #1

## 2023-12-31 ENCOUNTER — Ambulatory Visit (HOSPITAL_COMMUNITY): Admitting: Anesthesiology

## 2023-12-31 ENCOUNTER — Encounter (HOSPITAL_COMMUNITY): Payer: Self-pay | Admitting: Obstetrics and Gynecology

## 2023-12-31 ENCOUNTER — Other Ambulatory Visit: Payer: Self-pay

## 2023-12-31 ENCOUNTER — Encounter (HOSPITAL_COMMUNITY)
Admission: RE | Disposition: A | Discharge status: Home / Self Care | Payer: Self-pay | Source: Home / Self Care | Attending: Obstetrics and Gynecology

## 2023-12-31 ENCOUNTER — Ambulatory Visit (HOSPITAL_COMMUNITY)
Admission: RE | Admit: 2023-12-31 | Discharge: 2023-12-31 | Disposition: A | Discharge status: Home / Self Care | Attending: Obstetrics and Gynecology | Admitting: Obstetrics and Gynecology

## 2023-12-31 DIAGNOSIS — Z833 Family history of diabetes mellitus: Secondary | ICD-10-CM | POA: Diagnosis not present

## 2023-12-31 DIAGNOSIS — O3432 Maternal care for cervical incompetence, second trimester: Secondary | ICD-10-CM

## 2023-12-31 DIAGNOSIS — O09299 Supervision of pregnancy with other poor reproductive or obstetric history, unspecified trimester: Secondary | ICD-10-CM

## 2023-12-31 DIAGNOSIS — Z3A13 13 weeks gestation of pregnancy: Secondary | ICD-10-CM

## 2023-12-31 DIAGNOSIS — O24111 Pre-existing diabetes mellitus, type 2, in pregnancy, first trimester: Secondary | ICD-10-CM | POA: Diagnosis not present

## 2023-12-31 DIAGNOSIS — F418 Other specified anxiety disorders: Secondary | ICD-10-CM

## 2023-12-31 DIAGNOSIS — Z7984 Long term (current) use of oral hypoglycemic drugs: Secondary | ICD-10-CM | POA: Insufficient documentation

## 2023-12-31 DIAGNOSIS — O3431 Maternal care for cervical incompetence, first trimester: Secondary | ICD-10-CM | POA: Diagnosis not present

## 2023-12-31 DIAGNOSIS — Z3A12 12 weeks gestation of pregnancy: Secondary | ICD-10-CM | POA: Diagnosis not present

## 2023-12-31 DIAGNOSIS — N883 Incompetence of cervix uteri: Secondary | ICD-10-CM | POA: Diagnosis not present

## 2023-12-31 DIAGNOSIS — E119 Type 2 diabetes mellitus without complications: Secondary | ICD-10-CM

## 2023-12-31 HISTORY — PX: CERVICAL CERCLAGE: SHX1329

## 2023-12-31 LAB — TYPE AND SCREEN
ABO/RH(D): A POS
Antibody Screen: NEGATIVE

## 2023-12-31 LAB — CBC
HCT: 34.8 % — ABNORMAL LOW (ref 36.0–46.0)
Hemoglobin: 12.4 g/dL (ref 12.0–15.0)
MCH: 31.9 pg (ref 26.0–34.0)
MCHC: 35.6 g/dL (ref 30.0–36.0)
MCV: 89.5 fL (ref 80.0–100.0)
Platelets: 334 K/uL (ref 150–400)
RBC: 3.89 MIL/uL (ref 3.87–5.11)
RDW: 11.8 % (ref 11.5–15.5)
WBC: 8.9 K/uL (ref 4.0–10.5)
nRBC: 0 % (ref 0.0–0.2)

## 2023-12-31 LAB — GLUCOSE, CAPILLARY: Glucose-Capillary: 125 mg/dL — ABNORMAL HIGH (ref 70–99)

## 2023-12-31 SURGERY — CERCLAGE, CERVIX, VAGINAL APPROACH
Anesthesia: Spinal

## 2023-12-31 MED ORDER — ONDANSETRON HCL 4 MG/2ML IJ SOLN
INTRAMUSCULAR | Status: DC | PRN
  Administered 2023-12-31: 4 mg via INTRAVENOUS

## 2023-12-31 MED ORDER — LIDOCAINE HCL (PF) 1 % IJ SOLN
INTRAMUSCULAR | Status: AC
  Filled 2023-12-31: qty 5

## 2023-12-31 MED ORDER — DIPHENHYDRAMINE HCL 50 MG/ML IJ SOLN
INTRAMUSCULAR | Status: DC | PRN
Start: 2023-12-31 — End: 2023-12-31
  Administered 2023-12-31: 12.5 mg via INTRAVENOUS

## 2023-12-31 MED ORDER — ONDANSETRON HCL 4 MG/2ML IJ SOLN
INTRAMUSCULAR | Status: AC
  Filled 2023-12-31: qty 2

## 2023-12-31 MED ORDER — LACTATED RINGERS IV SOLN: INTRAVENOUS | Status: DC | PRN

## 2023-12-31 MED ORDER — DEXAMETHASONE SODIUM PHOSPHATE 10 MG/ML IJ SOLN
INTRAMUSCULAR | Status: AC
  Filled 2023-12-31: qty 1

## 2023-12-31 MED ORDER — STERILE WATER FOR IRRIGATION IR SOLN
Status: DC | PRN
  Administered 2023-12-31: 1000 mL

## 2023-12-31 MED ORDER — OXYCODONE HCL 5 MG PO TABS: 5.0000 mg | ORAL_TABLET | Freq: Once | ORAL | Status: DC | PRN

## 2023-12-31 MED ORDER — PHENYLEPHRINE 80 MCG/ML (10ML) SYRINGE FOR IV PUSH (FOR BLOOD PRESSURE SUPPORT)
PREFILLED_SYRINGE | INTRAVENOUS | Status: DC | PRN
  Administered 2023-12-31 (×2): 80 ug via INTRAVENOUS
  Administered 2023-12-31: 160 ug via INTRAVENOUS
  Administered 2023-12-31 (×6): 80 ug via INTRAVENOUS

## 2023-12-31 MED ORDER — BUPIVACAINE HCL (PF) 0.5 % IJ SOLN
INTRAMUSCULAR | Status: AC
  Filled 2023-12-31: qty 30

## 2023-12-31 MED ORDER — SODIUM CHLORIDE 0.9 % IR SOLN
Status: DC | PRN
  Administered 2023-12-31: 1000 mL

## 2023-12-31 MED ORDER — POVIDONE-IODINE 10 % EX SWAB
2.0000 | Freq: Once | CUTANEOUS | Status: AC
  Administered 2023-12-31: 2 via TOPICAL

## 2023-12-31 MED ORDER — PHENYLEPHRINE 80 MCG/ML (10ML) SYRINGE FOR IV PUSH (FOR BLOOD PRESSURE SUPPORT)
PREFILLED_SYRINGE | INTRAVENOUS | Status: AC
  Filled 2023-12-31: qty 10

## 2023-12-31 MED ORDER — OXYCODONE HCL 5 MG/5ML PO SOLN: 5.0000 mg | Freq: Once | ORAL | Status: DC | PRN

## 2023-12-31 MED ORDER — CHLOROPROCAINE HCL (PF) 3 % IJ SOLN
INTRAMUSCULAR | Status: DC | PRN
  Administered 2023-12-31: 1.6 mL via INTRATHECAL

## 2023-12-31 MED ORDER — LACTATED RINGERS IV SOLN: INTRAVENOUS | Status: DC

## 2023-12-31 MED ORDER — FENTANYL CITRATE (PF) 100 MCG/2ML IJ SOLN: 25.0000 ug | INTRAMUSCULAR | Status: DC | PRN

## 2023-12-31 MED ORDER — BUPIVACAINE HCL (PF) 0.25 % IJ SOLN
INTRAMUSCULAR | Status: AC
  Filled 2023-12-31: qty 10

## 2023-12-31 MED ORDER — LIDOCAINE HCL (PF) 1 % IJ SOLN
INTRAMUSCULAR | Status: DC | PRN
  Administered 2023-12-31: 5 mL

## 2023-12-31 MED ORDER — DIPHENHYDRAMINE HCL 50 MG/ML IJ SOLN
INTRAMUSCULAR | Status: AC
  Filled 2023-12-31: qty 1

## 2023-12-31 SURGICAL SUPPLY — 14 items
GLOVE BIO SURGEON STRL SZ 6.5 (GLOVE) ×4 IMPLANT
GLOVE BIOGEL PI IND STRL 6.5 (GLOVE) ×4 IMPLANT
GLOVE BIOGEL PI IND STRL 7.0 (GLOVE) ×2 IMPLANT
GOWN STRL REUS W/TWL LRG LVL3 (GOWN DISPOSABLE) ×4 IMPLANT
NDL MAYO CATGUT SZ4 TPR NDL (NEEDLE) ×2 IMPLANT
NEEDLE MAYO CATGUT SZ4 (NEEDLE) ×1 IMPLANT
NS IRRIG 1000ML POUR BTL (IV SOLUTION) ×2 IMPLANT
PACK VAGINAL MINOR WOMEN LF (CUSTOM PROCEDURE TRAY) ×2 IMPLANT
PAD OB MATERNITY 4.3X12.25 (PERSONAL CARE ITEMS) ×2 IMPLANT
PAD PREP 24X48 CUFFED NSTRL (MISCELLANEOUS) ×2 IMPLANT
TOWEL OR 17X24 6PK STRL BLUE (TOWEL DISPOSABLE) ×4 IMPLANT
TRAY FOLEY W/BAG SLVR 14FR LF (SET/KITS/TRAYS/PACK) IMPLANT
TUBING NON-CON 1/4 X 20 CONN (TUBING) IMPLANT
YANKAUER SUCT BULB TIP NO VENT (SUCTIONS) IMPLANT

## 2023-12-31 NOTE — Transfer of Care (Signed)
 Immediate Anesthesia Transfer of Care Note  Patient: Lynn Torres  Procedure(s) Performed: CERCLAGE, CERVIX, VAGINAL APPROACH  Patient Location: PACU  Anesthesia Type:Spinal  Level of Consciousness: awake and alert   Airway & Oxygen Therapy: Patient Spontanous Breathing  Post-op Assessment: Report given to RN  Post vital signs: Reviewed and stable  Last Vitals:  Vitals Value Taken Time  BP 115/67 12/31/23 11:09  Temp    Pulse 67 12/31/23 11:12  Resp 20 12/31/23 11:12  SpO2 100 % 12/31/23 11:12  Vitals shown include unfiled device data.  Last Pain:  Vitals:   12/31/23 0751  TempSrc: Oral  PainSc: 0-No pain         Complications: No notable events documented.

## 2023-12-31 NOTE — H&P (Signed)
 Lynn Torres is a 33 y.o. female presenting for scheduled procedure. Denies VB, LOF, cramping.   PNC c/b 1) PreDM - A1c immediately prior to pregnancy 8.2 - planning fetal echo, following BG, currently on BID metformin  2) h/o 2nd trim loss - 17wks, painless, CL visually WNL at 12wk scan 3) CHTN - no meds, baseline labs WNL  OB History     Gravida  2   Para  0   Term  0   Preterm  0   AB  1   Living  0      SAB  1   IAB  0   Ectopic  0   Multiple  0   Live Births  0          Past Medical History:  Diagnosis Date   Allergy    Pollen, pet dander, dust   Anxiety    Depression    History reviewed. No pertinent surgical history. Family History: family history includes Diabetes in her father and mother; Hyperlipidemia in her father and mother; Hypertension in her father and mother; Miscarriages / [place] in her mother. Social History:  reports that she has never smoked. She has never used smokeless tobacco. She reports that she does not currently use alcohol after a past usage of about 6.0 standard drinks of alcohol per week. She reports that she does not use drugs.     Maternal Diabetes: Yes:  Diabetes Type:  Pre-pregnancy Genetic Screening: Normal  Review of Systems  Constitutional:  Negative for chills and fever.  Respiratory:  Negative for shortness of breath.   Cardiovascular:  Negative for chest pain, palpitations and leg swelling.  Gastrointestinal:  Negative for abdominal pain and vomiting.  Neurological:  Negative for dizziness, weakness and headaches.  Psychiatric/Behavioral:  Negative for suicidal ideas.    Maternal Medical History:  Prenatal complications: PIH.   Prenatal Complications - Diabetes: type 2. Diabetes is managed by oral agent (monotherapy).       Blood pressure 128/78, pulse 88, temperature 98.2 F (36.8 C), temperature source Oral, resp. rate 16, height 5' 4 (1.626 m), weight 73.9 kg, SpO2 98%. Exam Physical  Exam Constitutional:      General: She is not in acute distress.    Appearance: She is well-developed.  HENT:     Head: Normocephalic and atraumatic.  Eyes:     Pupils: Pupils are equal, round, and reactive to light.  Cardiovascular:     Rate and Rhythm: Normal rate and regular rhythm.     Heart sounds: No murmur heard.    No gallop.  Abdominal:     Tenderness: There is no abdominal tenderness. There is no guarding or rebound.  Genitourinary:    Vagina: Normal.  Musculoskeletal:        General: Normal range of motion.     Cervical back: Normal range of motion and neck supple.  Skin:    General: Skin is warm and dry.  Neurological:     Mental Status: She is alert and oriented to person, place, and time.     Prenatal labs: ABO, Rh:  Apos Antibody:  neg Rubella:  imm RPR:   nr HBsAg:   neg HIV:   nr  Assessment/Plan: This is a 33yo G2P0010 by 9wk TVUS with history of 2nd trim loss 2/2 cervical incompetence presenting today for scheduled cerclage. RBA of procedure reviewed including injury to surrounding organs (vulva, vagina, uterus), bleeding, infection. Risks of possible rupture also reviewed with patient.  Patient agrees to procedure as reviewed.       Lynn Torres 12/31/2023, 8:04 AM

## 2023-12-31 NOTE — Anesthesia Postprocedure Evaluation (Signed)
 Anesthesia Post Note  Patient: Lynn Torres  Procedure(s) Performed: CERCLAGE, CERVIX, VAGINAL APPROACH     Patient location during evaluation: PACU Anesthesia Type: Spinal Level of consciousness: oriented and awake and alert Pain management: pain level controlled Vital Signs Assessment: post-procedure vital signs reviewed and stable Respiratory status: spontaneous breathing, respiratory function stable and patient connected to nasal cannula oxygen Cardiovascular status: blood pressure returned to baseline and stable Postop Assessment: no headache, no backache and no apparent nausea or vomiting Anesthetic complications: no   No notable events documented.  Last Vitals:  Vitals:   12/31/23 1130 12/31/23 1158  BP: 122/78 (!) 128/92  Pulse: 65 73  Resp: 14 18  Temp:    SpO2: 99% 100%    Last Pain:  Vitals:   12/31/23 1200  TempSrc:   PainSc: 0-No pain                 Adilenne Ashworth L Pearly Bartosik

## 2023-12-31 NOTE — Op Note (Signed)
 Preoperative Diagnosis: IUP at 12 6/7 weeks                                             History of incompetent cervix                                              History of 1 second trimester loss     Postoperative Diagnosis: Same     Procedure: Cervical cerclage : stitch at 12 o'clock     Surgeon: Lavonia Guppy, MD   Anesthesia: Spinal   Fluids: LR EBL: UOP:     Findings: Cervical stroma approx 1.5cm however anterior lip appears longer than posterior; closed. Very soft friable tissue distally     Specimen: none     Procedure Note Fetal heart tones confirmed in PACU  Pt taken to OR and spinal anesthesia administered without complications. Pt placed in dorsal lithotomy position and appropriate time out done. Pt was prepped with betadine , foley catheter placed for bladder decompression and pt draped in sterile fashion. Exam under anesthesia confirmed cervix still closed. Operative speculum placed with full visualization of cervix. 10cc of local anesthesia adm,inistered - 0.25% marcaine  at 3 and  9 o'clock. 0 polydex suture placed circumferentially with traction provided using a ring forceps. For lateral stitches, operative speculum replaced with devers x2. Knot at 12o-clock.  Essentially no bleeding noted at this time. All instruments then removed from pts vagina. Counts correct.  Pt to recovery room in stable condition. Tolerated procedure well

## 2023-12-31 NOTE — Progress Notes (Signed)
 Discharge instructions given to patient. Patient ambulating independently and home in stable condition with her husband.

## 2023-12-31 NOTE — Anesthesia Procedure Notes (Addendum)
 Spinal  Patient location during procedure: OR Start time: 12/31/2023 10:02 AM End time: 12/31/2023 10:05 AM Reason for block: surgical anesthesia Staffing Performed: anesthesiologist  Anesthesiologist: Niels Marien CROME, MD Performed by: Niels Marien CROME, MD Authorized by: Niels Marien CROME, MD   Preanesthetic Checklist Completed: patient identified, IV checked, risks and benefits discussed, surgical consent, monitors and equipment checked, pre-op evaluation and timeout performed Spinal Block Patient position: sitting Prep: DuraPrep and site prepped and draped Patient monitoring: cardiac monitor, continuous pulse ox and blood pressure Approach: midline Location: L3-4 Injection technique: single-shot Needle Needle type: Pencan  Needle gauge: 24 G Needle length: 9 cm Assessment Sensory level: T6 Events: CSF return Additional Notes Functioning IV was confirmed and monitors were applied. Sterile prep and drape, including hand hygiene and sterile gloves were used. The patient was positioned and the spine was prepped. The skin was anesthetized with lidocaine .  Free flow of clear CSF was obtained prior to injecting local anesthetic into the CSF.  The spinal needle aspirated freely following injection.  The needle was carefully withdrawn.  The patient tolerated the procedure well.

## 2023-12-31 NOTE — Anesthesia Preprocedure Evaluation (Signed)
 Anesthesia Evaluation  Patient identified by MRN, date of birth, ID band Patient awake    Reviewed: Allergy & Precautions, NPO status , Patient's Chart, lab work & pertinent test results  Airway Mallampati: II  TM Distance: >3 FB Neck ROM: Full    Dental no notable dental hx. (+) Teeth Intact, Dental Advisory Given   Pulmonary neg pulmonary ROS   Pulmonary exam normal breath sounds clear to auscultation       Cardiovascular negative cardio ROS Normal cardiovascular exam Rhythm:Regular Rate:Normal     Neuro/Psych  PSYCHIATRIC DISORDERS Anxiety Depression    negative neurological ROS     GI/Hepatic negative GI ROS, Neg liver ROS,,,  Endo/Other  diabetes, Type 2, Oral Hypoglycemic Agents    Renal/GU negative Renal ROS  negative genitourinary   Musculoskeletal negative musculoskeletal ROS (+)    Abdominal   Peds  Hematology negative hematology ROS (+)   Anesthesia Other Findings   Reproductive/Obstetrics (+) Pregnancy                              Anesthesia Physical Anesthesia Plan  ASA: 3  Anesthesia Plan: Spinal   Post-op Pain Management:    Induction:   PONV Risk Score and Plan: Treatment may vary due to age or medical condition  Airway Management Planned: Natural Airway  Additional Equipment:   Intra-op Plan:   Post-operative Plan:   Informed Consent: I have reviewed the patients History and Physical, chart, labs and discussed the procedure including the risks, benefits and alternatives for the proposed anesthesia with the patient or authorized representative who has indicated his/her understanding and acceptance.     Dental advisory given  Plan Discussed with: CRNA  Anesthesia Plan Comments:         Anesthesia Quick Evaluation

## 2024-01-06 ENCOUNTER — Other Ambulatory Visit (HOSPITAL_COMMUNITY): Payer: Self-pay

## 2024-01-06 ENCOUNTER — Other Ambulatory Visit (HOSPITAL_BASED_OUTPATIENT_CLINIC_OR_DEPARTMENT_OTHER): Payer: Self-pay

## 2024-01-06 ENCOUNTER — Other Ambulatory Visit: Payer: Self-pay

## 2024-01-07 ENCOUNTER — Other Ambulatory Visit (HOSPITAL_COMMUNITY): Payer: Self-pay

## 2024-01-14 DIAGNOSIS — O24111 Pre-existing diabetes mellitus, type 2, in pregnancy, first trimester: Secondary | ICD-10-CM | POA: Diagnosis not present

## 2024-01-14 DIAGNOSIS — O10911 Unspecified pre-existing hypertension complicating pregnancy, first trimester: Secondary | ICD-10-CM | POA: Diagnosis not present

## 2024-01-14 DIAGNOSIS — O3432 Maternal care for cervical incompetence, second trimester: Secondary | ICD-10-CM | POA: Diagnosis not present

## 2024-01-14 DIAGNOSIS — Z3A14 14 weeks gestation of pregnancy: Secondary | ICD-10-CM | POA: Diagnosis not present

## 2024-01-16 ENCOUNTER — Other Ambulatory Visit (HOSPITAL_COMMUNITY): Payer: Self-pay

## 2024-01-16 ENCOUNTER — Other Ambulatory Visit: Payer: Self-pay

## 2024-01-16 ENCOUNTER — Telehealth (HOSPITAL_COMMUNITY): Payer: Self-pay | Admitting: *Deleted

## 2024-01-16 ENCOUNTER — Encounter (HOSPITAL_COMMUNITY): Payer: Self-pay | Admitting: *Deleted

## 2024-01-16 MED ORDER — INSULIN PEN NEEDLE 31G X 8 MM MISC
2 refills | Status: AC
  Filled 2024-01-16: qty 100, 90d supply, fill #0
  Filled 2024-02-10 – 2024-04-15 (×6): qty 100, 90d supply, fill #1

## 2024-01-16 MED ORDER — HUMULIN N KWIKPEN 100 UNIT/ML ~~LOC~~ SUPN
10.0000 [IU] | PEN_INJECTOR | Freq: Two times a day (BID) | SUBCUTANEOUS | 4 refills | Status: DC
  Filled 2024-01-16: qty 3, 15d supply, fill #0
  Filled 2024-01-26 – 2024-01-28 (×2): qty 3, 15d supply, fill #1
  Filled 2024-02-10: qty 3, 15d supply, fill #2
  Filled 2024-02-23: qty 3, 15d supply, fill #3
  Filled 2024-03-03 (×2): qty 3, 15d supply, fill #4

## 2024-01-16 NOTE — Anesthesia Preprocedure Evaluation (Addendum)
 Anesthesia Evaluation  Patient identified by MRN, date of birth, ID band Patient awake    Reviewed: Allergy & Precautions, NPO status , Patient's Chart, lab work & pertinent test results  Airway Mallampati: II  TM Distance: >3 FB Neck ROM: Full    Dental no notable dental hx.    Pulmonary neg pulmonary ROS   Pulmonary exam normal breath sounds clear to auscultation       Cardiovascular negative cardio ROS Normal cardiovascular exam Rhythm:Regular Rate:Normal     Neuro/Psych  PSYCHIATRIC DISORDERS Anxiety Depression    negative neurological ROS     GI/Hepatic negative GI ROS, Neg liver ROS,,,  Endo/Other  diabetes, Well Controlled, Gestational, Oral Hypoglycemic Agents    Renal/GU negative Renal ROS  negative genitourinary   Musculoskeletal negative musculoskeletal ROS (+)    Abdominal   Peds  Hematology negative hematology ROS (+)   Anesthesia Other Findings   Reproductive/Obstetrics (+) Pregnancy 16 week cervical incompetency  Prior cerclage 12/31/23                              Anesthesia Physical Anesthesia Plan  ASA: 3  Anesthesia Plan: Spinal   Post-op Pain Management: Ofirmev  IV (intra-op)*   Induction:   PONV Risk Score and Plan: 2 and Ondansetron  and Treatment may vary due to age or medical condition  Airway Management Planned: Natural Airway  Additional Equipment: None  Intra-op Plan:   Post-operative Plan:   Informed Consent: I have reviewed the patients History and Physical, chart, labs and discussed the procedure including the risks, benefits and alternatives for the proposed anesthesia with the patient or authorized representative who has indicated his/her understanding and acceptance.       Plan Discussed with: CRNA  Anesthesia Plan Comments:          Anesthesia Quick Evaluation

## 2024-01-16 NOTE — Telephone Encounter (Signed)
 Preadmission screen Pt instructed to arrive at 1115 on 01/23/2024.  Nothing to eat after MN. Clear liquids until 0915.  May take zoloft  on morning of procedure.  The night before procedure take half of the prescribed NPH insulin  dose and no insulinon day of procedure.  All questions answred and instructions sent to Pt via MyChart as well.

## 2024-01-23 ENCOUNTER — Encounter (HOSPITAL_COMMUNITY)
Admission: RE | Disposition: A | Discharge status: Home / Self Care | Payer: Self-pay | Source: Home / Self Care | Attending: Obstetrics and Gynecology

## 2024-01-23 ENCOUNTER — Ambulatory Visit (HOSPITAL_COMMUNITY)
Admission: RE | Admit: 2024-01-23 | Discharge: 2024-01-23 | Disposition: A | Discharge status: Home / Self Care | Attending: Obstetrics and Gynecology | Admitting: Obstetrics and Gynecology

## 2024-01-23 ENCOUNTER — Other Ambulatory Visit: Payer: Self-pay

## 2024-01-23 ENCOUNTER — Ambulatory Visit (HOSPITAL_COMMUNITY): Payer: Self-pay | Admitting: Anesthesiology

## 2024-01-23 ENCOUNTER — Encounter (HOSPITAL_COMMUNITY): Payer: Self-pay | Admitting: Obstetrics and Gynecology

## 2024-01-23 DIAGNOSIS — O3432 Maternal care for cervical incompetence, second trimester: Secondary | ICD-10-CM | POA: Insufficient documentation

## 2024-01-23 DIAGNOSIS — Z833 Family history of diabetes mellitus: Secondary | ICD-10-CM | POA: Insufficient documentation

## 2024-01-23 DIAGNOSIS — E119 Type 2 diabetes mellitus without complications: Secondary | ICD-10-CM | POA: Diagnosis not present

## 2024-01-23 DIAGNOSIS — Z3A17 17 weeks gestation of pregnancy: Secondary | ICD-10-CM

## 2024-01-23 DIAGNOSIS — F418 Other specified anxiety disorders: Secondary | ICD-10-CM | POA: Diagnosis not present

## 2024-01-23 DIAGNOSIS — N883 Incompetence of cervix uteri: Secondary | ICD-10-CM

## 2024-01-23 DIAGNOSIS — Z3A16 16 weeks gestation of pregnancy: Secondary | ICD-10-CM | POA: Diagnosis not present

## 2024-01-23 DIAGNOSIS — Z794 Long term (current) use of insulin: Secondary | ICD-10-CM | POA: Diagnosis not present

## 2024-01-23 HISTORY — PX: CERVICAL CERCLAGE: SHX1329

## 2024-01-23 LAB — CBC
HCT: 34.5 % — ABNORMAL LOW (ref 36.0–46.0)
Hemoglobin: 11.9 g/dL — ABNORMAL LOW (ref 12.0–15.0)
MCH: 31.3 pg (ref 26.0–34.0)
MCHC: 34.5 g/dL (ref 30.0–36.0)
MCV: 90.8 fL (ref 80.0–100.0)
Platelets: 382 K/uL (ref 150–400)
RBC: 3.8 MIL/uL — ABNORMAL LOW (ref 3.87–5.11)
RDW: 11.8 % (ref 11.5–15.5)
WBC: 9.9 K/uL (ref 4.0–10.5)
nRBC: 0 % (ref 0.0–0.2)

## 2024-01-23 LAB — TYPE AND SCREEN
ABO/RH(D): A POS
Antibody Screen: NEGATIVE

## 2024-01-23 SURGERY — CERCLAGE, CERVIX, VAGINAL APPROACH
Anesthesia: Spinal

## 2024-01-23 MED ORDER — ONDANSETRON HCL 4 MG/2ML IJ SOLN
INTRAMUSCULAR | Status: AC
  Filled 2024-01-23: qty 2

## 2024-01-23 MED ORDER — PHENYLEPHRINE HCL (PRESSORS) 10 MG/ML IV SOLN
INTRAVENOUS | Status: DC | PRN
  Administered 2024-01-23 (×3): 80 ug via INTRAVENOUS

## 2024-01-23 MED ORDER — FENTANYL CITRATE (PF) 100 MCG/2ML IJ SOLN
INTRAMUSCULAR | Status: AC
  Filled 2024-01-23: qty 2

## 2024-01-23 MED ORDER — ONDANSETRON HCL 4 MG/2ML IJ SOLN: 4.0000 mg | Freq: Once | INTRAMUSCULAR | Status: DC | PRN

## 2024-01-23 MED ORDER — LIDOCAINE HCL 1 % IJ SOLN
INTRAMUSCULAR | Status: AC
  Filled 2024-01-23: qty 20

## 2024-01-23 MED ORDER — ONDANSETRON HCL 4 MG/2ML IJ SOLN
INTRAMUSCULAR | Status: DC | PRN
  Administered 2024-01-23 (×2): 4 mg via INTRAVENOUS

## 2024-01-23 MED ORDER — SODIUM CHLORIDE 0.9 % IV SOLN
12.5000 mg | Freq: Once | INTRAVENOUS | Status: DC
  Filled 2024-01-23: qty 0.5

## 2024-01-23 MED ORDER — FENTANYL CITRATE (PF) 100 MCG/2ML IJ SOLN
INTRAMUSCULAR | Status: DC | PRN
  Administered 2024-01-23: 15 ug via INTRAVENOUS

## 2024-01-23 MED ORDER — POVIDONE-IODINE 10 % EX SWAB
2.0000 | Freq: Once | CUTANEOUS | Status: AC
  Administered 2024-01-23: 2 via TOPICAL

## 2024-01-23 MED ORDER — FENTANYL CITRATE (PF) 100 MCG/2ML IJ SOLN: 25.0000 ug | INTRAMUSCULAR | Status: DC | PRN

## 2024-01-23 MED ORDER — LACTATED RINGERS IV SOLN: INTRAVENOUS | Status: DC

## 2024-01-23 MED ORDER — AMISULPRIDE (ANTIEMETIC) 5 MG/2ML IV SOLN: 10.0000 mg | Freq: Once | INTRAVENOUS | Status: DC | PRN

## 2024-01-23 MED ORDER — CHLOROPROCAINE HCL (PF) 3 % IJ SOLN
INTRAMUSCULAR | Status: DC | PRN
  Administered 2024-01-23: 1.6 mL

## 2024-01-23 MED ORDER — LIDOCAINE HCL (PF) 1 % IJ SOLN
INTRAMUSCULAR | Status: DC | PRN
  Administered 2024-01-23: 10 mL

## 2024-01-23 MED ORDER — OXYCODONE HCL 5 MG PO TABS: 5.0000 mg | ORAL_TABLET | Freq: Once | ORAL | Status: DC | PRN

## 2024-01-23 MED ORDER — AMISULPRIDE (ANTIEMETIC) 5 MG/2ML IV SOLN: 10.0000 mg | Freq: Once | INTRAVENOUS | Status: DC

## 2024-01-23 MED ORDER — STERILE WATER FOR IRRIGATION IR SOLN
Status: DC | PRN
  Administered 2024-01-23: 1000 mL

## 2024-01-23 MED ORDER — OXYCODONE HCL 5 MG/5ML PO SOLN: 5.0000 mg | Freq: Once | ORAL | Status: DC | PRN

## 2024-01-23 SURGICAL SUPPLY — 15 items
GLOVE BIO SURGEON STRL SZ 6.5 (GLOVE) ×4 IMPLANT
GLOVE BIOGEL PI IND STRL 6.5 (GLOVE) ×4 IMPLANT
GLOVE BIOGEL PI IND STRL 7.0 (GLOVE) ×2 IMPLANT
GOWN STRL REUS W/TWL LRG LVL3 (GOWN DISPOSABLE) ×4 IMPLANT
NDL MAYO CATGUT SZ4 TPR NDL (NEEDLE) ×2 IMPLANT
NEEDLE MAYO CATGUT SZ4 (NEEDLE) ×1 IMPLANT
NS IRRIG 1000ML POUR BTL (IV SOLUTION) ×2 IMPLANT
PACK VAGINAL MINOR WOMEN LF (CUSTOM PROCEDURE TRAY) ×2 IMPLANT
PAD OB MATERNITY 4.3X12.25 (PERSONAL CARE ITEMS) ×2 IMPLANT
PAD PREP 24X48 CUFFED NSTRL (MISCELLANEOUS) ×2 IMPLANT
SUT ETHIBOND 5 LR DA (SUTURE) ×2 IMPLANT
TOWEL OR 17X24 6PK STRL BLUE (TOWEL DISPOSABLE) ×4 IMPLANT
TRAY FOLEY W/BAG SLVR 14FR LF (SET/KITS/TRAYS/PACK) IMPLANT
TUBING NON-CON 1/4 X 20 CONN (TUBING) IMPLANT
YANKAUER SUCT BULB TIP NO VENT (SUCTIONS) IMPLANT

## 2024-01-23 NOTE — Anesthesia Postprocedure Evaluation (Signed)
 Anesthesia Post Note  Patient: Lynn Torres  Procedure(s) Performed: CERCLAGE, CERVIX, VAGINAL APPROACH     Patient location during evaluation: PACU Anesthesia Type: Spinal Level of consciousness: awake and alert and oriented Pain management: pain level controlled Vital Signs Assessment: post-procedure vital signs reviewed and stable Respiratory status: spontaneous breathing, nonlabored ventilation and respiratory function stable Cardiovascular status: blood pressure returned to baseline and stable Postop Assessment: no headache, no backache, spinal receding, patient able to bend at knees, no apparent nausea or vomiting and able to ambulate Anesthetic complications: no   No notable events documented.  Last Vitals:  Vitals:   01/23/24 1545 01/23/24 1600  BP: 132/85 131/77  Pulse: 75   Resp: 15 15  Temp:  36.9 C  SpO2: 99%     Last Pain:  Vitals:   01/23/24 1600  TempSrc: Oral  PainSc: 0-No pain   Pain Goal:    LLE Motor Response: Purposeful movement (01/23/24 1600) LLE Sensation: Full sensation (01/23/24 1600) RLE Motor Response: Purposeful movement (01/23/24 1600) RLE Sensation: Full sensation (01/23/24 1600)     Epidural/Spinal Function Cutaneous sensation: Normal sensation (01/23/24 1600), Patient able to flex knees: Yes (01/23/24 1600), Patient able to lift hips off bed: Yes (01/23/24 1600), Back pain beyond tenderness at insertion site: No (01/23/24 1600), Progressively worsening motor and/or sensory loss: No (01/23/24 1600), Bowel and/or bladder incontinence post epidural: No (01/23/24 1600)  Almarie CHRISTELLA Marchi

## 2024-01-23 NOTE — Anesthesia Procedure Notes (Signed)
 Spinal  Patient location during procedure: OR Start time: 01/23/2024 1:36 PM End time: 01/23/2024 1:38 PM Reason for block: surgical anesthesia Staffing Performed: anesthesiologist  Anesthesiologist: Merla Almarie HERO, DO Performed by: Merla Almarie HERO, DO Authorized by: Merla Almarie HERO, DO   Preanesthetic Checklist Completed: patient identified, IV checked, risks and benefits discussed, surgical consent, monitors and equipment checked, pre-op evaluation and timeout performed Spinal Block Patient position: sitting Prep: DuraPrep and site prepped and draped Patient monitoring: cardiac monitor, continuous pulse ox and blood pressure Approach: midline Location: L3-4 Injection technique: single-shot Needle Needle type: Pencan  Needle gauge: 24 G Needle length: 9 cm Assessment Sensory level: T6 Events: CSF return Additional Notes Functioning IV was confirmed and monitors were applied. Sterile prep and drape, including hand hygiene and sterile gloves were used. The patient was positioned and the spine was prepped. The skin was anesthetized with lidocaine .  Free flow of clear CSF was obtained prior to injecting local anesthetic into the CSF.  The spinal needle aspirated freely following injection.  The needle was carefully withdrawn.  The patient tolerated the procedure well.

## 2024-01-23 NOTE — Transfer of Care (Signed)
 Immediate Anesthesia Transfer of Care Note  Patient: Lynn Torres  Procedure(s) Performed: CERCLAGE, CERVIX, VAGINAL APPROACH  Patient Location: PACU  Anesthesia Type:Spinal  Level of Consciousness: awake, alert , and oriented  Airway & Oxygen Therapy: Patient Spontanous Breathing  Post-op Assessment: Report given to RN and Post -op Vital signs reviewed and stable  Post vital signs: Reviewed and stable  Last Vitals:  Vitals Value Taken Time  BP 124/73 01/23/24 14:33  Temp    Pulse 111 01/23/24 14:37  Resp 30 01/23/24 14:37  SpO2 99 % 01/23/24 14:37  Vitals shown include unfiled device data.  Last Pain:  Vitals:   01/23/24 1139  TempSrc: Oral  PainSc: 5          Complications: No notable events documented.

## 2024-01-23 NOTE — Interval H&P Note (Signed)
 History and Physical Interval Note:  01/23/2024 12:57 PM  Lynn Torres  has presented today for surgery, with the diagnosis of [redacted]w[redacted]d; previous cerclage that failed; needs repeat.  The various methods of treatment have been discussed with the patient and family. After consideration of risks, benefits and other options for treatment, the patient has consented to  Procedure(s): CERCLAGE, CERVIX, VAGINAL APPROACH (N/A) as a surgical intervention.  The patient's history has been reviewed, patient examined, no change in status, stable for surgery.  I have reviewed the patient's chart and labs.  Questions were answered to the patient's satisfaction.    BSUS - +FHT and FM noted  Lynn Torres

## 2024-01-23 NOTE — Op Note (Signed)
  Preoperative Diagnosis: IUP at 16 1/7 weeks                                             History of incompetent cervix                                              History of 1 second trimester loss                                             Prior cerclage with posterior stitch deflected     Postoperative Diagnosis: Same     Procedure: Cervical cerclage : stitch at 12 o'clock     Surgeon: Lavonia Guppy, MD Assist: Rosaline Chapel, MD   Anesthesia: Spinal   Fluids: LR 1L EBL: 50ccml UOP:     Findings: Cervical stroma approx 1.5cm however anterior lip appears longer than posterior; closed. Very soft friable tissue distally. Prior cerclage from 8/27 with knot at 12 o clock visualized prior to case with posterior loop crossing over external os     Specimen: none     Procedure Note Fetal heart tones confirmed in PACU  Pt taken to OR and spinal anesthesia administered without complications. Pt placed in dorsal lithotomy position and appropriate time out done. Pt was prepped with betadine , foley catheter placed for bladder decompression and pt draped in sterile fashion. Exam under anesthesia confirmed cervix still closed. Prior cerclage visualized as above. Operative speculum placed with full visualization of cervix. 10cc of local anesthesia adm,inistered - 0.25% marcaine  at 3 and  9 o'clock. 0 polydex suture placed circumferentially with traction provided using a ring forceps. For lateral stitches, operative speculum replaced with devers x2. Knot at 12o-clock.  Prior suture then removed in totality. Essentially no bleeding noted at this time. All instruments then removed from pts vagina. Counts correct.   Pt to recovery room in stable condition. Tolerated procedure well

## 2024-01-23 NOTE — H&P (Signed)
 Lynn Torres is a 33 y.o. female presenting for scheduled procedure. Denies VB, LOF, cramping.    PNC c/b 1) PreDM - A1c immediately prior to pregnancy 8.2 - planning fetal echo, following BG, currently on BID NPH 10u  2) h/o 2nd trim loss - 17wks, painless, CL visually WNL at 12wk scan. Prior cerclage placed on 8/27. Exam on 9/10 showed the following: TVUS today with +FHR. Unfortunately, eidence of only anerior knot. SSE confirms loop slipped off of posterior cervix, exernal os is closed.  3) CHTN - no meds, baseline labs WNL   OB History       Gravida  2   Para  0   Term  0   Preterm  0   AB  1   Living  0        SAB  1   IAB  0   Ectopic  0   Multiple  0   Live Births  0                 Past Medical History:  Diagnosis Date   Allergy      Pollen, pet dander, dust   Anxiety     Depression          History reviewed. No pertinent surgical history.     Family History: family history includes Diabetes in her father and mother; Hyperlipidemia in her father and mother; Hypertension in her father and mother; Miscarriages / [place] in her mother. Social History:  reports that she has never smoked. She has never used smokeless tobacco. She reports that she does not currently use alcohol after a past usage of about 6.0 standard drinks of alcohol per week. She reports that she does not use drugs.        Maternal Diabetes: Yes:  Diabetes Type:  Pre-pregnancy Genetic Screening: Normal   Review of Systems  Constitutional:  Negative for chills and fever.  Respiratory:  Negative for shortness of breath.   Cardiovascular:  Negative for chest pain, palpitations and leg swelling.  Gastrointestinal:  Negative for abdominal pain and vomiting.  Neurological:  Negative for dizziness, weakness and headaches.  Psychiatric/Behavioral:  Negative for suicidal ideas.    Maternal Medical History:  Prenatal complications: PIH.   Prenatal Complications - Diabetes: type  2. Diabetes is managed by insulin .      BP 134/83   Pulse 94   Temp 98.5 F (36.9 C) (Oral)   Resp 16   Ht 5' 4 (1.626 m)   Wt 73.3 kg   LMP  (LMP Unknown)   SpO2 98%   BMI 27.74 kg/m   Exam Physical Exam Constitutional:      General: She is not in acute distress.    Appearance: She is well-developed.  HENT:     Head: Normocephalic and atraumatic.  Eyes:     Pupils: Pupils are equal, round, and reactive to light.  Cardiovascular:     Rate and Rhythm: Normal rate and regular rhythm.     Heart sounds: No murmur heard.    No gallop.  Abdominal:     Tenderness: There is no abdominal tenderness. There is no guarding or rebound.  Genitourinary:    Vagina: Normal.  Musculoskeletal:        General: Normal range of motion.     Cervical back: Normal range of motion and neck supple.  Skin:    General: Skin is warm and dry.  Neurological:     Mental Status:  She is alert and oriented to person, place, and time.     Prenatal labs: ABO, Rh:  Apos Antibody:  neg Rubella:  imm RPR:   nr HBsAg:   neg HIV:   nr   Assessment/Plan: This is a 33yo G2P0010 at 62 1/7 by 9wk TVUS with history of 2nd trim loss 2/2 cervical incompetence presenting today for scheduled rescue cerclage 2/2 evidence of posterior stitch slipping on postop f/u. RBA of procedure reviewed including injury to surrounding organs (vulva, vagina, uterus), bleeding, infection. Risks of possible rupture also reviewed with patient. Patient agrees to procedure as reviewed.

## 2024-01-26 ENCOUNTER — Other Ambulatory Visit (HOSPITAL_COMMUNITY): Payer: Self-pay

## 2024-01-26 MED ORDER — METOCLOPRAMIDE HCL 10 MG PO TABS
10.0000 mg | ORAL_TABLET | Freq: Three times a day (TID) | ORAL | 1 refills | Status: AC | PRN
  Filled 2024-01-26: qty 42, 14d supply, fill #0

## 2024-01-28 ENCOUNTER — Other Ambulatory Visit (HOSPITAL_COMMUNITY): Payer: Self-pay

## 2024-01-29 ENCOUNTER — Other Ambulatory Visit: Payer: Self-pay

## 2024-02-05 DIAGNOSIS — Z8742 Personal history of other diseases of the female genital tract: Secondary | ICD-10-CM | POA: Diagnosis not present

## 2024-02-05 DIAGNOSIS — O24111 Pre-existing diabetes mellitus, type 2, in pregnancy, first trimester: Secondary | ICD-10-CM | POA: Diagnosis not present

## 2024-02-05 DIAGNOSIS — Z3A18 18 weeks gestation of pregnancy: Secondary | ICD-10-CM | POA: Diagnosis not present

## 2024-02-05 DIAGNOSIS — O3430 Maternal care for cervical incompetence, unspecified trimester: Secondary | ICD-10-CM | POA: Diagnosis not present

## 2024-02-10 ENCOUNTER — Other Ambulatory Visit: Payer: Self-pay

## 2024-02-10 ENCOUNTER — Other Ambulatory Visit (HOSPITAL_COMMUNITY): Payer: Self-pay

## 2024-02-11 DIAGNOSIS — Z8742 Personal history of other diseases of the female genital tract: Secondary | ICD-10-CM | POA: Diagnosis not present

## 2024-02-11 DIAGNOSIS — Z3A18 18 weeks gestation of pregnancy: Secondary | ICD-10-CM | POA: Diagnosis not present

## 2024-02-11 DIAGNOSIS — O24119 Pre-existing diabetes mellitus, type 2, in pregnancy, unspecified trimester: Secondary | ICD-10-CM | POA: Diagnosis not present

## 2024-02-11 DIAGNOSIS — O139 Gestational [pregnancy-induced] hypertension without significant proteinuria, unspecified trimester: Secondary | ICD-10-CM | POA: Diagnosis not present

## 2024-02-11 DIAGNOSIS — O24111 Pre-existing diabetes mellitus, type 2, in pregnancy, first trimester: Secondary | ICD-10-CM | POA: Diagnosis not present

## 2024-02-11 DIAGNOSIS — Z363 Encounter for antenatal screening for malformations: Secondary | ICD-10-CM | POA: Diagnosis not present

## 2024-02-15 ENCOUNTER — Encounter (HOSPITAL_COMMUNITY): Payer: Self-pay | Admitting: *Deleted

## 2024-02-15 ENCOUNTER — Other Ambulatory Visit: Payer: Self-pay

## 2024-02-15 ENCOUNTER — Emergency Department (HOSPITAL_COMMUNITY)
Admission: EM | Admit: 2024-02-15 | Discharge: 2024-02-16 | Disposition: A | Attending: Emergency Medicine | Admitting: Emergency Medicine

## 2024-02-15 DIAGNOSIS — Z3A19 19 weeks gestation of pregnancy: Secondary | ICD-10-CM | POA: Diagnosis not present

## 2024-02-15 DIAGNOSIS — R7989 Other specified abnormal findings of blood chemistry: Secondary | ICD-10-CM | POA: Diagnosis not present

## 2024-02-15 DIAGNOSIS — O99891 Other specified diseases and conditions complicating pregnancy: Secondary | ICD-10-CM | POA: Diagnosis not present

## 2024-02-15 DIAGNOSIS — R0789 Other chest pain: Secondary | ICD-10-CM | POA: Diagnosis not present

## 2024-02-15 DIAGNOSIS — R079 Chest pain, unspecified: Secondary | ICD-10-CM | POA: Insufficient documentation

## 2024-02-15 DIAGNOSIS — Z794 Long term (current) use of insulin: Secondary | ICD-10-CM | POA: Diagnosis not present

## 2024-02-15 DIAGNOSIS — O99412 Diseases of the circulatory system complicating pregnancy, second trimester: Secondary | ICD-10-CM | POA: Diagnosis not present

## 2024-02-15 LAB — HCG, SERUM, QUALITATIVE: Preg, Serum: POSITIVE — AB

## 2024-02-15 LAB — BASIC METABOLIC PANEL WITH GFR
Anion gap: 12 (ref 5–15)
BUN: 6 mg/dL (ref 6–20)
CO2: 19 mmol/L — ABNORMAL LOW (ref 22–32)
Calcium: 9.2 mg/dL (ref 8.9–10.3)
Chloride: 102 mmol/L (ref 98–111)
Creatinine, Ser: 0.36 mg/dL — ABNORMAL LOW (ref 0.44–1.00)
GFR, Estimated: >60 mL/min (ref >60)
Glucose, Bld: 149 mg/dL — ABNORMAL HIGH (ref 70–99)
Potassium: 3.6 mmol/L (ref 3.5–5.1)
Sodium: 133 mmol/L — ABNORMAL LOW (ref 135–145)

## 2024-02-15 LAB — CBC
HCT: 32.2 % — ABNORMAL LOW (ref 36.0–46.0)
Hemoglobin: 11.3 g/dL — ABNORMAL LOW (ref 12.0–15.0)
MCH: 31.7 pg (ref 26.0–34.0)
MCHC: 35.1 g/dL (ref 30.0–36.0)
MCV: 90.4 fL (ref 80.0–100.0)
Platelets: 345 K/uL (ref 150–400)
RBC: 3.56 MIL/uL — ABNORMAL LOW (ref 3.87–5.11)
RDW: 11.5 % (ref 11.5–15.5)
WBC: 9.7 K/uL (ref 4.0–10.5)
nRBC: 0 % (ref 0.0–0.2)

## 2024-02-15 LAB — TROPONIN I (HIGH SENSITIVITY): Troponin I (High Sensitivity): 7 ng/L (ref <18)

## 2024-02-15 MED ORDER — ONDANSETRON 4 MG PO TBDP
8.0000 mg | ORAL_TABLET | Freq: Once | ORAL | Status: AC
Start: 2024-02-15 — End: 2024-02-15
  Administered 2024-02-15: 8 mg via ORAL
  Filled 2024-02-15: qty 2

## 2024-02-15 NOTE — ED Provider Notes (Incomplete)
 [place] EMERGENCY DEPARTMENT AT [facility] Provider Note   CSN: [id number] Arrival date & time: 02/15/24  2233     Patient presents with: Chest Pain and Shortness of Breath   Lynn Torres is a 33 y.o. female.  {Add pertinent medical, surgical, social history, OB history to YEP:67052} Presents to the emergency department for evaluation of chest pain.  Patient is [redacted] weeks pregnant.  She reports that she has been having constant pain with some sharp worsening pain intermittently.  Pain worsens when she lies down.  She has also been feeling short of breath and notices increased pain when she takes a deep breath.       Prior to Admission medications   Medication Sig Start Date End Date Taking? Authorizing Provider  Lancets [facility] DELICA PLUS [place]) MISC Use as directed. Check fasting glucose 4 times daily. Fasting morning, and 1 hour after each meal (breakfast, lunch and dinner). 12/15/23   Shivaji, Lavonia HERO, MD  acetaminophen  (TYLENOL ) 500 MG tablet Take 500-1,000 mg by mouth every 6 (six) hours as needed (pain.).    [provider]  Blood Glucose Monitoring Suppl (ONE TOUCH ULTRA 2) w/Device KIT Use as directed to check blood glucose. 12/15/23   Shivaji, Lavonia HERO, MD  glucose blood (ONETOUCH ULTRA BLUE TEST) test strip Use as directed. Check fasting glucose 4 times daily. Fasting morning, and 1 hour after each meal (breakfast, lunch and dinner). 12/15/23   Shivaji, Lavonia HERO, MD  Insulin  NPH, Human,, Isophane, (HUMULIN  N KWIKPEN) 100 UNIT/ML Kiwkpen Inject 10 Units into the skin 2 (two) times daily. 01/15/24     Insulin  Pen Needle 31G X 8 MM MISC Use as directed. 01/15/24   Shivaji, Lavonia HERO, MD  levocetirizine (XYZAL) 5 MG tablet Take 5 mg by mouth in the morning.    [provider]  metFORMIN  (GLUCOPHAGE ) 500 MG tablet Take 1 tablet (500 mg total) by mouth 2 (two) times daily. Patient not taking: Reported on 01/21/2024 12/29/23     metoCLOPramide  (REGLAN )  10 MG tablet Take 1 tablet (10 mg total) by mouth 3 (three) times daily as needed. 01/26/24     Omega 3 1000 MG CAPS Take 1,000 mg by mouth in the morning.    [provider]  Prenatal Vit-Fe Fumarate-FA (PRENATAL MULTIVITAMIN) TABS tablet Take 2 tablets by mouth in the morning.    [provider]  Probiotic Product (PROBIOTIC PO) Take 1 capsule by mouth in the morning. Prebiotic    [provider]  sertraline  (ZOLOFT ) 50 MG tablet Take 1 tablet (50 mg total) by mouth daily. 12/10/23       Allergies: Bee pollen and Cat dander    Review of Systems  Updated Vital Signs BP 136/89   Pulse 96   Temp 98.1 F (36.7 C) (Oral)   Resp (!) 25   Ht 5' 4 (1.626 m)   Wt 73.3 kg   LMP  (LMP Unknown)   SpO2 98%   BMI 27.74 kg/m   Physical Exam  (all labs ordered are listed, but only abnormal results are displayed) Labs Reviewed  BASIC METABOLIC PANEL WITH GFR - Abnormal; Notable for the following components:      Result Value   Sodium 133 (*)    CO2 19 (*)    Glucose, Bld 149 (*)    Creatinine, Ser 0.36 (*)    All other components within normal limits  CBC - Abnormal; Notable for the following components:  RBC 3.56 (*)    Hemoglobin 11.3 (*)    HCT 32.2 (*)    All other components within normal limits  HCG, SERUM, QUALITATIVE - Abnormal; Notable for the following components:   Preg, Serum POSITIVE (*)    All other components within normal limits  D-DIMER, QUANTITATIVE  TROPONIN I (HIGH SENSITIVITY)    EKG: EKG Interpretation Date/Time:  Sunday February 15 2024 22:46:04 EDT Ventricular Rate:  116 PR Interval:  144 QRS Duration:  84 QT Interval:  302 QTC Calculation: 419 R Axis:   96  Text Interpretation: Sinus tachycardia Rightward axis Cannot rule out Anterior infarct , age undetermined Abnormal ECG No previous ECGs available Confirmed by Haze Lonni PARAS [id number]) on 02/15/2024 11:43:06 PM  Radiology: No results found.  {Document cardiac  monitor, telemetry assessment procedure when appropriate:32947} Procedures   Medications Ordered in the ED  ondansetron  (ZOFRAN -ODT) disintegrating tablet 8 mg (8 mg Oral Given 02/15/24 2258)      {Click here for ABCD2, HEART and other calculators REFRESH Note before signing:1}                              Medical Decision Making Amount and/or Complexity of Data Reviewed Labs: ordered.   ***  {Document critical care time when appropriate  Document review of labs and clinical decision tools ie CHADS2VASC2, etc  Document your independent review of radiology images and any outside records  Document your discussion with family members, caretakers and with consultants  Document social determinants of health affecting pt's care  Document your decision making why or why not admission, treatments were needed:32947:::1}   Final diagnoses:  None    ED Discharge Orders     None

## 2024-02-15 NOTE — ED Provider Notes (Signed)
 [place] EMERGENCY DEPARTMENT AT [facility] Provider Note   CSN: [id number] Arrival date & time: 02/15/24  2233     Patient presents with: Chest Pain and Shortness of Breath   Lynn Torres is a 33 y.o. female.   Presents to the emergency department for evaluation of chest pain.  Patient is [redacted] weeks pregnant.  She reports that she has been having constant pain with some sharp worsening pain intermittently.  Pain worsens when she lies down.  She has also been feeling short of breath and notices increased pain when she takes a deep breath.       Prior to Admission medications   Medication Sig Start Date End Date Taking? Authorizing Provider  Lancets [facility] DELICA PLUS [place]) MISC Use as directed. Check fasting glucose 4 times daily. Fasting morning, and 1 hour after each meal (breakfast, lunch and dinner). 12/15/23   Shivaji, Lavonia HERO, MD  acetaminophen  (TYLENOL ) 500 MG tablet Take 500-1,000 mg by mouth every 6 (six) hours as needed (pain.).    [provider]  Blood Glucose Monitoring Suppl (ONE TOUCH ULTRA 2) w/Device KIT Use as directed to check blood glucose. 12/15/23   Shivaji, Lavonia HERO, MD  glucose blood (ONETOUCH ULTRA BLUE TEST) test strip Use as directed. Check fasting glucose 4 times daily. Fasting morning, and 1 hour after each meal (breakfast, lunch and dinner). 12/15/23   Shivaji, Lavonia HERO, MD  Insulin  NPH, Human,, Isophane, (HUMULIN  N KWIKPEN) 100 UNIT/ML Kiwkpen Inject 10 Units into the skin 2 (two) times daily. 01/15/24     Insulin  Pen Needle 31G X 8 MM MISC Use as directed. 01/15/24   Shivaji, Lavonia HERO, MD  levocetirizine (XYZAL) 5 MG tablet Take 5 mg by mouth in the morning.    [provider]  metFORMIN  (GLUCOPHAGE ) 500 MG tablet Take 1 tablet (500 mg total) by mouth 2 (two) times daily. Patient not taking: Reported on 01/21/2024 12/29/23     metoCLOPramide  (REGLAN ) 10 MG tablet Take 1 tablet (10 mg total) by mouth 3 (three) times daily as  needed. 01/26/24     Omega 3 1000 MG CAPS Take 1,000 mg by mouth in the morning.    [provider]  Prenatal Vit-Fe Fumarate-FA (PRENATAL MULTIVITAMIN) TABS tablet Take 2 tablets by mouth in the morning.    [provider]  Probiotic Product (PROBIOTIC PO) Take 1 capsule by mouth in the morning. Prebiotic    [provider]  sertraline  (ZOLOFT ) 50 MG tablet Take 1 tablet (50 mg total) by mouth daily. 12/10/23       Allergies: Bee pollen and Cat dander    Review of Systems  Updated Vital Signs BP (!) 137/92   Pulse 89   Temp 98.1 F (36.7 C) (Oral)   Resp (!) 26   Ht 5' 4 (1.626 m)   Wt 73.3 kg   LMP  (LMP Unknown)   SpO2 96%   BMI 27.74 kg/m   Physical Exam  (all labs ordered are listed, but only abnormal results are displayed) Labs Reviewed  BASIC METABOLIC PANEL WITH GFR - Abnormal; Notable for the following components:      Result Value   Sodium 133 (*)    CO2 19 (*)    Glucose, Bld 149 (*)    Creatinine, Ser 0.36 (*)    All other components within normal limits  CBC - Abnormal; Notable for the following components:   RBC 3.56 (*)    Hemoglobin 11.3 (*)  HCT 32.2 (*)    All other components within normal limits  HCG, SERUM, QUALITATIVE - Abnormal; Notable for the following components:   Preg, Serum POSITIVE (*)    All other components within normal limits  D-DIMER, QUANTITATIVE - Abnormal; Notable for the following components:   D-Dimer, Quant 1.01 (*)    All other components within normal limits  TROPONIN I (HIGH SENSITIVITY)  TROPONIN I (HIGH SENSITIVITY)    EKG: EKG Interpretation Date/Time:  Sunday February 15 2024 22:46:04 EDT Ventricular Rate:  116 PR Interval:  144 QRS Duration:  84 QT Interval:  302 QTC Calculation: 419 R Axis:   96  Text Interpretation: Sinus tachycardia Rightward axis Cannot rule out Anterior infarct , age undetermined Abnormal ECG No previous ECGs available Confirmed by Haze Lonni PARAS [id number])  on 02/15/2024 11:43:06 PM  Radiology: CT Angio Chest Pulmonary Embolism (PE) W or WO Contrast Result Date: 02/16/2024 CLINICAL DATA:  Pulmonary embolism (PE) suspected, high prob positive d-dimer EXAM: CT ANGIOGRAPHY CHEST WITH CONTRAST TECHNIQUE: Multidetector CT imaging of the chest was performed using the standard protocol during bolus administration of intravenous contrast. Multiplanar CT image reconstructions and MIPs were obtained to evaluate the vascular anatomy. RADIATION DOSE REDUCTION: This exam was performed according to the departmental dose-optimization program which includes automated exposure control, adjustment of the mA and/or kV according to patient size and/or use of iterative reconstruction technique. CONTRAST:  75mL OMNIPAQUE IOHEXOL 350 MG/ML SOLN COMPARISON:  None Available. FINDINGS: Cardiovascular: Heart is normal size. Aorta is normal caliber. No filling defects in the pulmonary arteries to suggest pulmonary emboli. Mediastinum/Nodes: No mediastinal, hilar, or axillary adenopathy. Trachea and esophagus are unremarkable. Thyroid unremarkable. Lungs/Pleura: No confluent opacities or effusions. Upper Abdomen: No acute findings Musculoskeletal: Chest wall soft tissues are unremarkable. No acute bony abnormality. Review of the MIP images confirms the above findings. IMPRESSION: No evidence of pulmonary embolus. No acute cardiopulmonary disease. Electronically Signed   By: Franky Crease M.D.   On: 02/16/2024 01:11     Procedures   Medications Ordered in the ED  ondansetron  (ZOFRAN -ODT) disintegrating tablet 8 mg (8 mg Oral Given 02/15/24 2258)  iohexol (OMNIPAQUE) 350 MG/ML injection 75 mL (75 mLs Intravenous Contrast Given 02/16/24 0109)                                    Medical Decision Making Amount and/or Complexity of Data Reviewed Labs: ordered. Decision-making details documented in ED Course. Radiology: ordered and independent interpretation performed. Decision-making  details documented in ED Course. ECG/medicine tests: ordered and independent interpretation performed. Decision-making details documented in ED Course.  Risk Prescription drug management.   Differential Diagnosis considered includes, but not limited to: STEMI; NSTEMI; myocarditis; pericarditis; pulmonary embolism; aortic dissection; pneumothorax; pneumonia; gastritis; musculoskeletal pain  Presents to the department for evaluation of chest pain.  Patient is [redacted] weeks pregnant.  Patient reports worsening pain with lying flat as well as shortness of breath and pleuritic pain.  No cough or URI symptoms to explain her her pain.  Patient without signs or sequela of congestive heart failure/cardiomyopathy of pregnancy.  No signs of hypertensive crisis.  Blood pressure is very slightly elevated, but chart review reveals that this has been present at previous OB/GYN visits.  19 weeks is very early for preeclampsia.  This will need to be monitored but does not appear to be related to her presentation today.  Had a  discussion with her and her partner about PE risk.  Presentation is slightly concerning with pleuritic pain, hypercoagulable state in pregnancy and tachycardia.  Unfortunately, D-dimer was elevated.  After discussion of risks and benefits, did proceed to CT angiography.  PE does not show any acute abnormality.  Troponins have been negative.  Pain is of unclear etiology at this time, but acute coronary syndrome, cardiomyopathy and PE have been ruled out.  Consider GI cause.  Follow-up with OB/GYN.     Final diagnoses:  Chest pain during pregnancy    ED Discharge Orders     None          Nevea Spiewak, Lonni PARAS, MD 02/16/24 0121

## 2024-02-15 NOTE — ED Triage Notes (Signed)
 Pt reports chest pain/tightness/pressure and SHOB x1 day. Pt [redacted] weeks pregnant.

## 2024-02-16 ENCOUNTER — Emergency Department (HOSPITAL_COMMUNITY)

## 2024-02-16 LAB — D-DIMER, QUANTITATIVE: D-Dimer, Quant: 1.01 ug{FEU}/mL — ABNORMAL HIGH (ref 0.00–0.50)

## 2024-02-16 LAB — TROPONIN I (HIGH SENSITIVITY): Troponin I (High Sensitivity): 10 ng/L (ref <18)

## 2024-02-16 MED ORDER — IOHEXOL 350 MG/ML SOLN
75.0000 mL | Freq: Once | INTRAVENOUS | Status: AC | PRN
Start: 2024-02-16 — End: 2024-02-16
  Administered 2024-02-16: 75 mL via INTRAVENOUS

## 2024-02-16 NOTE — ED Provider Notes (Signed)
 Patient [redacted] weeks pregnant.  She presents with chest pain, shortness of breath, tachycardia.  Discussed risks and benefits of CT.  As her D-dimer is elevated and pretest probability is high, decided to proceed with CT scan.  Patient consented to procedure during pregnancy.   Haze Lonni PARAS, MD 02/16/24 [phone]

## 2024-02-22 ENCOUNTER — Other Ambulatory Visit (HOSPITAL_COMMUNITY): Payer: Self-pay

## 2024-02-23 ENCOUNTER — Other Ambulatory Visit: Payer: Self-pay

## 2024-02-25 ENCOUNTER — Other Ambulatory Visit (HOSPITAL_COMMUNITY): Payer: Self-pay

## 2024-02-25 MED ORDER — HUMULIN N KWIKPEN 100 UNIT/ML ~~LOC~~ SUPN
PEN_INJECTOR | SUBCUTANEOUS | 4 refills | Status: AC
  Filled 2024-02-25 – 2024-03-02 (×3): qty 9, 32d supply, fill #0
  Filled 2024-03-03: qty 3, 11d supply, fill #0
  Filled 2024-03-14: qty 3, 11d supply, fill #1
  Filled 2024-03-24: qty 3, 11d supply, fill #2

## 2024-03-02 ENCOUNTER — Other Ambulatory Visit: Payer: Self-pay

## 2024-03-02 ENCOUNTER — Other Ambulatory Visit (HOSPITAL_COMMUNITY): Payer: Self-pay

## 2024-03-02 DIAGNOSIS — Z3A22 22 weeks gestation of pregnancy: Secondary | ICD-10-CM | POA: Diagnosis not present

## 2024-03-02 DIAGNOSIS — Z8742 Personal history of other diseases of the female genital tract: Secondary | ICD-10-CM | POA: Diagnosis not present

## 2024-03-02 DIAGNOSIS — Z362 Encounter for other antenatal screening follow-up: Secondary | ICD-10-CM | POA: Diagnosis not present

## 2024-03-02 DIAGNOSIS — O24111 Pre-existing diabetes mellitus, type 2, in pregnancy, first trimester: Secondary | ICD-10-CM | POA: Diagnosis not present

## 2024-03-02 DIAGNOSIS — Z3A21 21 weeks gestation of pregnancy: Secondary | ICD-10-CM | POA: Diagnosis not present

## 2024-03-02 MED ORDER — DEXCOM G7 SENSOR MISC
1.0000 | 0 refills | Status: AC
  Filled 2024-03-02: qty 3, 30d supply, fill #0

## 2024-03-03 ENCOUNTER — Other Ambulatory Visit (HOSPITAL_COMMUNITY): Payer: Self-pay

## 2024-03-03 ENCOUNTER — Other Ambulatory Visit: Payer: Self-pay

## 2024-03-17 ENCOUNTER — Other Ambulatory Visit (HOSPITAL_COMMUNITY): Payer: Self-pay

## 2024-03-17 MED ORDER — HUMULIN N KWIKPEN 100 UNIT/ML ~~LOC~~ SUPN
PEN_INJECTOR | SUBCUTANEOUS | 3 refills | Status: AC
  Filled 2024-03-17: qty 12, 35d supply, fill #0
  Filled 2024-03-17: qty 12, 34d supply, fill #0
  Filled 2024-03-24: qty 12, 36d supply, fill #0
  Filled 2024-04-27: qty 12, 36d supply, fill #1

## 2024-03-24 ENCOUNTER — Other Ambulatory Visit: Payer: Self-pay

## 2024-03-24 ENCOUNTER — Other Ambulatory Visit (HOSPITAL_COMMUNITY): Payer: Self-pay

## 2024-03-25 ENCOUNTER — Other Ambulatory Visit (HOSPITAL_COMMUNITY): Payer: Self-pay

## 2024-03-26 ENCOUNTER — Other Ambulatory Visit: Payer: Self-pay

## 2024-03-29 ENCOUNTER — Other Ambulatory Visit (HOSPITAL_COMMUNITY): Payer: Self-pay

## 2024-03-29 DIAGNOSIS — Z3A25 25 weeks gestation of pregnancy: Secondary | ICD-10-CM | POA: Diagnosis not present

## 2024-03-29 DIAGNOSIS — O24119 Pre-existing diabetes mellitus, type 2, in pregnancy, unspecified trimester: Secondary | ICD-10-CM | POA: Diagnosis not present

## 2024-03-31 ENCOUNTER — Other Ambulatory Visit (HOSPITAL_COMMUNITY): Payer: Self-pay

## 2024-03-31 MED ORDER — DEXCOM G7 SENSOR MISC
2 refills | Status: AC
  Filled 2024-03-31: qty 3, 30d supply, fill #0
  Filled 2024-05-02: qty 3, 30d supply, fill #1
  Filled 2024-06-01: qty 3, 30d supply, fill #2

## 2024-04-03 ENCOUNTER — Other Ambulatory Visit (HOSPITAL_COMMUNITY): Payer: Self-pay

## 2024-04-14 ENCOUNTER — Other Ambulatory Visit (HOSPITAL_COMMUNITY): Payer: Self-pay

## 2024-04-14 DIAGNOSIS — Z23 Encounter for immunization: Secondary | ICD-10-CM | POA: Diagnosis not present

## 2024-04-14 DIAGNOSIS — O99343 Other mental disorders complicating pregnancy, third trimester: Secondary | ICD-10-CM | POA: Diagnosis not present

## 2024-04-14 DIAGNOSIS — O24319 Unspecified pre-existing diabetes mellitus in pregnancy, unspecified trimester: Secondary | ICD-10-CM | POA: Diagnosis not present

## 2024-04-14 DIAGNOSIS — Z3689 Encounter for other specified antenatal screening: Secondary | ICD-10-CM | POA: Diagnosis not present

## 2024-04-14 DIAGNOSIS — Z3A27 27 weeks gestation of pregnancy: Secondary | ICD-10-CM | POA: Diagnosis not present

## 2024-04-14 MED ORDER — SERTRALINE HCL 100 MG PO TABS
100.0000 mg | ORAL_TABLET | Freq: Every day | ORAL | 1 refills | Status: AC
  Filled 2024-04-14: qty 90, 90d supply, fill #0

## 2024-04-27 DIAGNOSIS — O24111 Pre-existing diabetes mellitus, type 2, in pregnancy, first trimester: Secondary | ICD-10-CM | POA: Diagnosis not present

## 2024-04-27 DIAGNOSIS — O24119 Pre-existing diabetes mellitus, type 2, in pregnancy, unspecified trimester: Secondary | ICD-10-CM | POA: Diagnosis not present

## 2024-04-27 DIAGNOSIS — O10911 Unspecified pre-existing hypertension complicating pregnancy, first trimester: Secondary | ICD-10-CM | POA: Diagnosis not present

## 2024-04-27 DIAGNOSIS — Z3A29 29 weeks gestation of pregnancy: Secondary | ICD-10-CM | POA: Diagnosis not present

## 2024-05-26 ENCOUNTER — Other Ambulatory Visit: Payer: Self-pay

## 2024-05-26 ENCOUNTER — Other Ambulatory Visit (HOSPITAL_COMMUNITY): Payer: Self-pay

## 2024-05-26 MED ORDER — HUMULIN N KWIKPEN 100 UNIT/ML ~~LOC~~ SUPN
PEN_INJECTOR | SUBCUTANEOUS | 3 refills | Status: AC
  Filled 2024-05-26: qty 12, 31d supply, fill #0
  Filled 2024-05-28: qty 12, 30d supply, fill #0

## 2024-05-27 ENCOUNTER — Encounter (HOSPITAL_COMMUNITY): Payer: Self-pay

## 2024-05-28 ENCOUNTER — Ambulatory Visit: Payer: Self-pay | Admitting: Pediatrics

## 2024-05-28 ENCOUNTER — Other Ambulatory Visit (HOSPITAL_COMMUNITY): Payer: Self-pay

## 2024-05-28 DIAGNOSIS — Z7681 Expectant parent(s) prebirth pediatrician visit: Secondary | ICD-10-CM

## 2024-06-01 NOTE — Progress Notes (Signed)
 Prenatal counseling for impending newborn done--  Reviewed current vaccine policy and answered all questions.  1st child, Currently 34 weeks, Current complications:  LGA, gest DM, Prenatal care initiated:  early Z76.81

## 2024-06-02 ENCOUNTER — Other Ambulatory Visit (HOSPITAL_COMMUNITY): Payer: Self-pay

## 2024-06-03 ENCOUNTER — Other Ambulatory Visit (HOSPITAL_COMMUNITY): Payer: Self-pay

## 2024-06-04 ENCOUNTER — Other Ambulatory Visit (HOSPITAL_COMMUNITY): Payer: Self-pay

## 2024-06-05 ENCOUNTER — Other Ambulatory Visit (HOSPITAL_COMMUNITY): Payer: Self-pay

## 2024-06-21 ENCOUNTER — Inpatient Hospital Stay (HOSPITAL_COMMUNITY): Admission: RE | Admit: 2024-06-21 | Source: Home / Self Care | Admitting: Obstetrics and Gynecology

## 2024-06-21 ENCOUNTER — Inpatient Hospital Stay (HOSPITAL_COMMUNITY)
